# Patient Record
Sex: Male | Born: 1975 | Hispanic: Yes | Marital: Married | Smoking: Never smoker
Health system: Southern US, Community
[De-identification: ages and names within clinical notes are randomized; demographics above are authoritative.]

## PROBLEM LIST (undated history)

## (undated) DIAGNOSIS — J45909 Unspecified asthma, uncomplicated: Secondary | ICD-10-CM

## (undated) DIAGNOSIS — K5792 Diverticulitis of intestine, part unspecified, without perforation or abscess without bleeding: Secondary | ICD-10-CM

## (undated) DIAGNOSIS — K56609 Unspecified intestinal obstruction, unspecified as to partial versus complete obstruction: Secondary | ICD-10-CM

## (undated) HISTORY — DX: Diverticulitis of intestine, part unspecified, without perforation or abscess without bleeding: K57.92

## (undated) HISTORY — PX: APPENDECTOMY: SHX54

## (undated) HISTORY — PX: ABDOMINAL SURGERY: SHX537

## (undated) HISTORY — PX: HERNIA REPAIR: SHX51

---

## 2019-10-28 ENCOUNTER — Emergency Department (HOSPITAL_COMMUNITY): Payer: Self-pay

## 2019-10-28 ENCOUNTER — Encounter (HOSPITAL_COMMUNITY): Payer: Self-pay

## 2019-10-28 ENCOUNTER — Other Ambulatory Visit: Payer: Self-pay

## 2019-10-28 ENCOUNTER — Emergency Department (HOSPITAL_COMMUNITY)
Admission: EM | Admit: 2019-10-28 | Discharge: 2019-10-28 | Disposition: A | Discharge status: Home / Self Care | Payer: Self-pay | Attending: Emergency Medicine | Admitting: Emergency Medicine

## 2019-10-28 DIAGNOSIS — K5792 Diverticulitis of intestine, part unspecified, without perforation or abscess without bleeding: Secondary | ICD-10-CM | POA: Insufficient documentation

## 2019-10-28 DIAGNOSIS — J45909 Unspecified asthma, uncomplicated: Secondary | ICD-10-CM | POA: Insufficient documentation

## 2019-10-28 HISTORY — DX: Unspecified intestinal obstruction, unspecified as to partial versus complete obstruction: K56.609

## 2019-10-28 HISTORY — DX: Unspecified asthma, uncomplicated: J45.909

## 2019-10-28 LAB — COMPREHENSIVE METABOLIC PANEL
ALT: 39 U/L (ref 0–44)
AST: 28 U/L (ref 15–41)
Albumin: 4 g/dL (ref 3.5–5.0)
Alkaline Phosphatase: 39 U/L (ref 38–126)
Anion gap: 9 (ref 5–15)
BUN: 12 mg/dL (ref 6–20)
CO2: 25 mmol/L (ref 22–32)
Calcium: 8.9 mg/dL (ref 8.9–10.3)
Chloride: 106 mmol/L (ref 98–111)
Creatinine, Ser: 0.76 mg/dL (ref 0.61–1.24)
GFR calc Af Amer: >60 mL/min (ref >60)
GFR calc non Af Amer: >60 mL/min (ref >60)
Glucose, Bld: 108 mg/dL — ABNORMAL HIGH (ref 70–99)
Potassium: 4 mmol/L (ref 3.5–5.1)
Sodium: 140 mmol/L (ref 135–145)
Total Bilirubin: 0.8 mg/dL (ref 0.3–1.2)
Total Protein: 7.8 g/dL (ref 6.5–8.1)

## 2019-10-28 LAB — URINALYSIS, ROUTINE W REFLEX MICROSCOPIC
Bacteria, UA: NONE SEEN
Bilirubin Urine: NEGATIVE
Glucose, UA: NEGATIVE mg/dL
Hgb urine dipstick: NEGATIVE
Ketones, ur: NEGATIVE mg/dL
Leukocytes,Ua: NEGATIVE
Nitrite: NEGATIVE
Protein, ur: NEGATIVE mg/dL
Specific Gravity, Urine: >1.046 — ABNORMAL HIGH (ref 1.005–1.030)
pH: 7 (ref 5.0–8.0)

## 2019-10-28 LAB — CBC
HCT: 46.2 % (ref 39.0–52.0)
Hemoglobin: 15.6 g/dL (ref 13.0–17.0)
MCH: 31.5 pg (ref 26.0–34.0)
MCHC: 33.8 g/dL (ref 30.0–36.0)
MCV: 93.1 fL (ref 80.0–100.0)
Platelets: 221 10*3/uL (ref 150–400)
RBC: 4.96 MIL/uL (ref 4.22–5.81)
RDW: 12.2 % (ref 11.5–15.5)
WBC: 7.9 10*3/uL (ref 4.0–10.5)
nRBC: 0 % (ref 0.0–0.2)

## 2019-10-28 LAB — LIPASE, BLOOD: Lipase: 23 U/L (ref 11–51)

## 2019-10-28 MED ORDER — METRONIDAZOLE 500 MG PO TABS: 500.0000 mg | ORAL_TABLET | Freq: Three times a day (TID) | ORAL | 0 refills | Status: AC

## 2019-10-28 MED ORDER — SODIUM CHLORIDE 0.9% FLUSH: 3.0000 mL | Freq: Once | INTRAVENOUS | Status: DC

## 2019-10-28 MED ORDER — MORPHINE SULFATE (PF) 4 MG/ML IV SOLN
4.0000 mg | Freq: Once | INTRAVENOUS | Status: AC
  Administered 2019-10-28: 4 mg via INTRAVENOUS
  Filled 2019-10-28: qty 1

## 2019-10-28 MED ORDER — CIPROFLOXACIN HCL 500 MG PO TABS: 500.0000 mg | ORAL_TABLET | Freq: Two times a day (BID) | ORAL | 0 refills | Status: AC

## 2019-10-28 MED ORDER — IOHEXOL 300 MG/ML  SOLN
100.0000 mL | Freq: Once | INTRAMUSCULAR | Status: AC | PRN
  Administered 2019-10-28: 13:00:00 100 mL via INTRAVENOUS

## 2019-10-28 MED ORDER — SODIUM CHLORIDE (PF) 0.9 % IJ SOLN
INTRAMUSCULAR | Status: AC
  Filled 2019-10-28: qty 50

## 2019-10-28 NOTE — Discharge Instructions (Signed)
Take the antibiotics as directed. Take the entire course of antibiotics regardless of symptom improvement to prevent worsening or recurrence of your infection. Return to the ED if you start to have worsening abdominal pain that is unrelieved with Tylenol or ibuprofen, develop a fever, chest pain, shortness of breath.

## 2019-10-28 NOTE — ED Triage Notes (Signed)
Patient c/o lower abdominal pain that began a few days ago. Patient states the pain is worse when he coughs, moves, and palpates the area. Paitenat denies any N/v/D.

## 2019-10-28 NOTE — ED Provider Notes (Signed)
[facility]-EMERGENCY DEPT Provider Note   CSN: [id number] Arrival date & time: 10/28/19  1120     History Chief Complaint  Patient presents with  . Abdominal Pain    Marcus Thompson is a 44 y.o. male with a past medical history of bowel obstruction, prior appendectomy and inguinal hernia repair presenting to ED with a chief complaint of lower abdominal pain that has been intermittent for the past 3 to 4 days but worsened last night.  Reports constant pain since last night.  Denies any nausea, vomiting, diarrhea or constipation.  Reports that pain is sharp, located more so on the left side of the abdomen.  Pain is worse with movement and palpation and feels similar to when he had an inguinal hernia in the past.  He has not taken any medications to help with his symptoms.  Denies any dysuria, fever, sick contacts with similar symptoms, history of kidney stones, shortness of breath or chest pain.  HPI     Past Medical History:  Diagnosis Date  . Asthma   . Bowel obstruction (HCC)     There are no problems to display for this patient.   Past Surgical History:  Procedure Laterality Date  . ABDOMINAL SURGERY    . APPENDECTOMY    . HERNIA REPAIR         Family History  Problem Relation Age of Onset  . Hypertension Mother   . Cancer Father     Social History   Tobacco Use  . Smoking status: Never Smoker  . Smokeless tobacco: Never Used  Substance Use Topics  . Alcohol use: Yes  . Drug use: Never    Home Medications Prior to Admission medications   Medication Sig Start Date End Date Taking? Authorizing Provider  ciprofloxacin (CIPRO) 500 MG tablet Take 1 tablet (500 mg total) by mouth 2 (two) times daily for 7 days. 10/28/19 11/04/19  Kazden Largo, PA-C  metroNIDAZOLE (FLAGYL) 500 MG tablet Take 1 tablet (500 mg total) by mouth 3 (three) times daily for 7 days. 10/28/19 11/04/19  Elyshia Kumagai, Hillary Bow, PA-C    Allergies    Aspirin  Review of Systems   Review  of Systems  Constitutional: Negative for appetite change, chills and fever.  HENT: Negative for ear pain, rhinorrhea, sneezing and sore throat.   Eyes: Negative for photophobia and visual disturbance.  Respiratory: Negative for cough, chest tightness, shortness of breath and wheezing.   Cardiovascular: Negative for chest pain and palpitations.  Gastrointestinal: Positive for abdominal pain. Negative for blood in stool, constipation, diarrhea, nausea and vomiting.  Genitourinary: Negative for dysuria, hematuria and urgency.  Musculoskeletal: Negative for myalgias.  Skin: Negative for rash.  Neurological: Negative for dizziness, weakness and light-headedness.    Physical Exam Updated Vital Signs BP (!) 130/92 (BP Location: Right Arm)   Pulse 64   Temp 98.3 F (36.8 C) (Oral)   Resp 16   Ht 5\' 6"  (1.676 m)   Wt 93.4 kg   SpO2 98%   BMI 33.25 kg/m   Physical Exam Vitals and nursing note reviewed.  Constitutional:      General: He is not in acute distress.    Appearance: He is well-developed.  HENT:     Head: Normocephalic and atraumatic.     Nose: Nose normal.  Eyes:     General: No scleral icterus.       Left eye: No discharge.     Conjunctiva/sclera: Conjunctivae normal.  Cardiovascular:  Rate and Rhythm: Normal rate and regular rhythm.     Heart sounds: Normal heart sounds. No murmur. No friction rub. No gallop.   Pulmonary:     Effort: Pulmonary effort is normal. No respiratory distress.     Breath sounds: Normal breath sounds.  Abdominal:     General: Bowel sounds are normal. There is no distension.     Palpations: Abdomen is soft.     Tenderness: There is abdominal tenderness in the left lower quadrant. There is no right CVA tenderness, left CVA tenderness, guarding or rebound.  Musculoskeletal:        General: Normal range of motion.     Cervical back: Normal range of motion and neck supple.  Skin:    General: Skin is warm and dry.     Findings: No rash.    Neurological:     Mental Status: He is alert.     Motor: No abnormal muscle tone.     Coordination: Coordination normal.     ED Results / Procedures / Treatments   Labs (all labs ordered are listed, but only abnormal results are displayed) Labs Reviewed  COMPREHENSIVE METABOLIC PANEL - Abnormal; Notable for the following components:      Result Value   Glucose, Bld 108 (*)    All other components within normal limits  URINALYSIS, ROUTINE W REFLEX MICROSCOPIC - Abnormal; Notable for the following components:   Specific Gravity, Urine >1.046 (*)    All other components within normal limits  LIPASE, BLOOD  CBC    EKG None  Radiology CT ABDOMEN PELVIS W CONTRAST  Result Date: 10/28/2019 CLINICAL DATA:  Lower abdominal pain EXAM: CT ABDOMEN AND PELVIS WITH CONTRAST TECHNIQUE: Multidetector CT imaging of the abdomen and pelvis was performed using the standard protocol following bolus administration of intravenous contrast. CONTRAST:  OMNIPAQUE IOHEXOL 300 MG/ML  SOLN COMPARISON:  None. FINDINGS: Lower chest: No acute abnormality. Hepatobiliary: Diffusely decreased attenuation of the hepatic parenchyma suggesting hepatic steatosis. No focal hepatic lesion. Gallbladder appears unremarkable. No hyperdense gallstone. No biliary dilatation. Pancreas: Unremarkable. No pancreatic ductal dilatation or surrounding inflammatory changes. Spleen: Normal in size without focal abnormality. Adrenals/Urinary Tract: Adrenal glands are unremarkable. Kidneys are normal, without renal calculi, focal lesion, or hydronephrosis. Bladder is unremarkable. Stomach/Bowel: Numerous colonic diverticula. There is a mildly prominent diverticula within the proximal sigmoid colon with subtle pericolonic fat stranding (series 2, images 61-63). No adjacent free fluid, abscess, or extraluminal air. Stomach and small bowel are unremarkable. No bowel obstruction. Vascular/Lymphatic: No significant vascular findings are  present. No enlarged abdominal or pelvic lymph nodes. Reproductive: Prostate is unremarkable. Other: No abdominal wall hernia or abnormality. No abdominopelvic ascites. Musculoskeletal: No acute or significant osseous findings. IMPRESSION: 1. Early acute uncomplicated sigmoid diverticulitis. No adjacent free fluid, abscess, or extraluminal air. 2. Hepatic steatosis. Electronically Signed   By: Duanne Guess D.O.   On: 10/28/2019 13:33    Procedures Procedures (including critical care time)  Medications Ordered in ED Medications  sodium chloride flush (NS) 0.9 % injection 3 mL (3 mLs Intravenous Not Given 10/28/19 1359)  morphine 4 MG/ML injection 4 mg (has no administration in time range)  iohexol (OMNIPAQUE) 300 MG/ML solution 100 mL (100 mLs Intravenous Contrast Given 10/28/19 1313)  sodium chloride (PF) 0.9 % injection (  Given by Other 10/28/19 1359)    ED Course  I have reviewed the triage vital signs and the nursing notes.  Pertinent labs & imaging results that were  available during my care of the patient were reviewed by me and considered in my medical decision making (see chart for details).  Clinical Course as of Oct 28 1419  Fri Oct 28, 2019  1217 Patient declining any pain medication or antiemetics at this time.   [HK]  [id number] 44 year old male with prior history of bowel obstruction complaining of left lower quadrant pain similar to when he had an obstruction.  Still passing gas.  Soft abdomen without tympany on exam.  Disposition per results of CT.   [MB]    Clinical Course User Index [HK] Delia Heady, PA-C [MB] Hayden Rasmussen, MD   MDM Rules/Calculators/A&P                      44 year old male with prior bowel obstruction, history of appendicitis and inguinal hernia repair presenting to the ED with left lower quadrant abdominal pain that began a few days ago.  Denies any changes to bowel movements, urination or vomiting.  On exam patient is tender in the left lower  quadrant area without rebound or guarding.  Vital signs are within normal limits, he is afebrile with no recent use of antipyretics.  CBC, CMP, lipase and urinalysis unremarkable.  CT scan done to rule out bowel obstruction without evidence of bowel obstruction but does show acute early sigmoid diverticulitis.  Patient denies history of of diverticulitis in the past.  He is tolerating p.o. without difficulty and pain controlled here.  Will be discharged home with Cipro and Flagyl and strict return precautions.  Patient is hemodynamically stable, in NAD, and able to ambulate in the ED. Evaluation does not show pathology that would require ongoing emergent intervention or inpatient treatment. I have personally reviewed and interpreted all lab work and imaging at today's ED visit. I explained the diagnosis to the patient. Pain has been managed and has no complaints prior to discharge. Patient is comfortable with above plan and is stable for discharge at this time. All questions were answered prior to disposition. Strict return precautions for returning to the ED were discussed. Encouraged follow up with PCP.   An After Visit Summary was printed and given to the patient.   Portions of this note were generated with [employer]. Dictation errors may occur despite best attempts at proofreading.  Final Clinical Impression(s) / ED Diagnoses Final diagnoses:  Diverticulitis    Rx / DC Orders ED Discharge Orders         Ordered    metroNIDAZOLE (FLAGYL) 500 MG tablet  3 times daily     10/28/19 1415    ciprofloxacin (CIPRO) 500 MG tablet  2 times daily     10/28/19 1415           Delia Heady, PA-C 10/28/19 1421    Hayden Rasmussen, MD 10/28/19 1816

## 2021-09-10 ENCOUNTER — Encounter: Payer: Self-pay | Admitting: Emergency Medicine

## 2021-09-10 ENCOUNTER — Ambulatory Visit
Admission: EM | Admit: 2021-09-10 | Discharge: 2021-09-10 | Disposition: A | Discharge status: Home / Self Care | Payer: 59 | Attending: Physician Assistant | Admitting: Physician Assistant

## 2021-09-10 ENCOUNTER — Other Ambulatory Visit: Payer: Self-pay

## 2021-09-10 DIAGNOSIS — J020 Streptococcal pharyngitis: Secondary | ICD-10-CM

## 2021-09-10 LAB — POCT RAPID STREP A (OFFICE): Rapid Strep A Screen: POSITIVE — AB

## 2021-09-10 MED ORDER — CEFTRIAXONE SODIUM 1 G IJ SOLR
1.0000 g | Freq: Once | INTRAMUSCULAR | Status: AC
  Administered 2021-09-10: 1 g via INTRAMUSCULAR

## 2021-09-10 MED ORDER — AMOXICILLIN 500 MG PO CAPS: 500.0000 mg | ORAL_CAPSULE | Freq: Three times a day (TID) | ORAL | 0 refills | Status: DC

## 2021-09-10 MED ORDER — METHYLPREDNISOLONE SODIUM SUCC 125 MG IJ SOLR
125.0000 mg | Freq: Once | INTRAMUSCULAR | Status: AC
  Administered 2021-09-10: 125 mg via INTRAMUSCULAR

## 2021-09-10 NOTE — ED Provider Notes (Signed)
[facility]    CSN: [id number] Arrival date & time: 09/10/21  0907      History   Chief Complaint Chief Complaint  Patient presents with   Sore Throat    HPI Marcus Thompson is a 46 y.o. male.   Patient here today for evaluation of sore throat he has had for 7 days.  He states that today symptoms seem to be worsening is developed some fever and body aches as well.  He has tried Tylenol without significant relief.  He reports his daughter has also been diagnosed with pharyngitis.  The history is provided by the patient.  Sore Throat Pertinent negatives include no abdominal pain and no shortness of breath.   Past Medical History:  Diagnosis Date   Asthma    Bowel obstruction (HCC)     There are no problems to display for this patient.   Past Surgical History:  Procedure Laterality Date   ABDOMINAL SURGERY     APPENDECTOMY     HERNIA REPAIR         Home Medications    Prior to Admission medications   Medication Sig Start Date End Date Taking? Authorizing Provider  amoxicillin (AMOXIL) 500 MG capsule Take 1 capsule (500 mg total) by mouth 3 (three) times daily. 09/10/21  Yes Tomi Bamberger, PA-C    Family History Family History  Problem Relation Age of Onset   Hypertension Mother    Cancer Father     Social History Social History   Tobacco Use   Smoking status: Never   Smokeless tobacco: Never  Vaping Use   Vaping Use: Never used  Substance Use Topics   Alcohol use: Yes   Drug use: Never     Allergies   Aspirin   Review of Systems Review of Systems  Constitutional:  Positive for chills and fever.  HENT:  Positive for sore throat and trouble swallowing. Negative for congestion and ear pain.   Eyes:  Negative for discharge and redness.  Respiratory:  Negative for cough and shortness of breath.   Gastrointestinal:  Negative for abdominal pain, nausea and vomiting.  Musculoskeletal:  Positive for myalgias.    Physical  Exam Triage Vital Signs ED Triage Vitals [09/10/21 0934]  Enc Vitals Group     BP (!) 142/91     Pulse Rate (!) 119     Resp      Temp 99.3 F (37.4 C)     Temp Source Oral     SpO2 94 %     Weight 185 lb (83.9 kg)     Height 5\' 6"  (1.676 m)     Head Circumference      Peak Flow      Pain Score 10     Pain Loc      Pain Edu?      Excl. in GC?    No data found.  Updated Vital Signs BP (!) 142/91 (BP Location: Left Arm)    Pulse (!) 119    Temp 99.3 F (37.4 C) (Oral)    Ht 5\' 6"  (1.676 m)    Wt 185 lb (83.9 kg)    SpO2 94%    BMI 29.86 kg/m   Physical Exam Vitals and nursing note reviewed.  Constitutional:      General: He is not in acute distress.    Appearance: Normal appearance. He is not ill-appearing.  HENT:     Head: Normocephalic and atraumatic.  Nose: Nose normal. No congestion.     Mouth/Throat:     Mouth: Mucous membranes are moist.     Pharynx: Oropharynx is clear. No oropharyngeal exudate or posterior oropharyngeal erythema.     Tonsils: 4+ on the right. 4+ on the left.     Comments: Significant swelling to bilateral tonsils, patient is maintaining secretions however is not having any difficulty breathing Eyes:     Conjunctiva/sclera: Conjunctivae normal.  Cardiovascular:     Rate and Rhythm: Normal rate.  Pulmonary:     Effort: Pulmonary effort is normal. No respiratory distress.  Skin:    General: Skin is warm and dry.  Neurological:     Mental Status: He is alert.  Psychiatric:        Mood and Affect: Mood normal.        Thought Content: Thought content normal.     UC Treatments / Results  Labs (all labs ordered are listed, but only abnormal results are displayed) Labs Reviewed  POCT RAPID STREP A (OFFICE) - Abnormal; Notable for the following components:      Result Value   Rapid Strep A Screen Positive (*)    All other components within normal limits    EKG   Radiology No results found.  Procedures Procedures (including  critical care time)  Medications Ordered in UC Medications  methylPREDNISolone sodium succinate (SOLU-MEDROL) 125 mg/2 mL injection 125 mg (125 mg Intramuscular Given 09/10/21 1007)  cefTRIAXone (ROCEPHIN) injection 1 g (1 g Intramuscular Given 09/10/21 1007)    Initial Impression / Assessment and Plan / UC Course  I have reviewed the triage vital signs and the nursing notes.  Pertinent labs & imaging results that were available during my care of the patient were reviewed by me and considered in my medical decision making (see chart for details).    Initially recommended emergency department evaluation due to swelling of tonsils but patient refuses.  We will trial injection of methylprednisolone as well as Rocephin in hopes to rapidly improve symptoms but did recommend further evaluation in the ED with any worsening.  Patient agrees.  Amoxicillin also sent to pharmacy for treatment.  Final Clinical Impressions(s) / UC Diagnoses   Final diagnoses:  Acute streptococcal pharyngitis     Discharge Instructions         REPORT TO ED IMMEDIATELY WITH ANY WORSENING        ED Prescriptions     Medication Sig Dispense Auth. Provider   amoxicillin (AMOXIL) 500 MG capsule Take 1 capsule (500 mg total) by mouth 3 (three) times daily. 21 capsule Tomi Bamberger, PA-C      PDMP not reviewed this encounter.   Tomi Bamberger, PA-C 09/10/21 1010

## 2021-09-10 NOTE — ED Notes (Signed)
Rocephin 1grm given in right upper out quadrant

## 2021-09-10 NOTE — ED Triage Notes (Signed)
Patient c/o sore throat x 7 days, worse yesterday, fever, body aches.  Patient has taken Tylenol.  Patient is vaccinated for COVID.

## 2021-09-10 NOTE — Discharge Instructions (Signed)
° ° °  REPORT TO ED IMMEDIATELY WITH ANY WORSENING

## 2021-11-25 ENCOUNTER — Emergency Department (HOSPITAL_COMMUNITY)
Admission: EM | Admit: 2021-11-25 | Discharge: 2021-11-25 | Disposition: A | Discharge status: Home / Self Care | Payer: Self-pay | Attending: Emergency Medicine | Admitting: Emergency Medicine

## 2021-11-25 ENCOUNTER — Other Ambulatory Visit: Payer: Self-pay

## 2021-11-25 ENCOUNTER — Encounter (HOSPITAL_COMMUNITY): Payer: Self-pay | Admitting: Oncology

## 2021-11-25 ENCOUNTER — Emergency Department (HOSPITAL_COMMUNITY): Payer: Self-pay

## 2021-11-25 DIAGNOSIS — K5792 Diverticulitis of intestine, part unspecified, without perforation or abscess without bleeding: Secondary | ICD-10-CM

## 2021-11-25 DIAGNOSIS — K5732 Diverticulitis of large intestine without perforation or abscess without bleeding: Secondary | ICD-10-CM | POA: Insufficient documentation

## 2021-11-25 DIAGNOSIS — J45909 Unspecified asthma, uncomplicated: Secondary | ICD-10-CM | POA: Insufficient documentation

## 2021-11-25 LAB — COMPREHENSIVE METABOLIC PANEL
ALT: 26 U/L (ref 0–44)
AST: 16 U/L (ref 15–41)
Albumin: 3.8 g/dL (ref 3.5–5.0)
Alkaline Phosphatase: 46 U/L (ref 38–126)
Anion gap: 5 (ref 5–15)
BUN: 14 mg/dL (ref 6–20)
CO2: 27 mmol/L (ref 22–32)
Calcium: 8.9 mg/dL (ref 8.9–10.3)
Chloride: 103 mmol/L (ref 98–111)
Creatinine, Ser: 0.81 mg/dL (ref 0.61–1.24)
GFR, Estimated: >60 mL/min (ref >60)
Glucose, Bld: 107 mg/dL — ABNORMAL HIGH (ref 70–99)
Potassium: 3.7 mmol/L (ref 3.5–5.1)
Sodium: 135 mmol/L (ref 135–145)
Total Bilirubin: 0.8 mg/dL (ref 0.3–1.2)
Total Protein: 7.7 g/dL (ref 6.5–8.1)

## 2021-11-25 LAB — CBC WITH DIFFERENTIAL/PLATELET
Abs Immature Granulocytes: 0.09 10*3/uL — ABNORMAL HIGH (ref 0.00–0.07)
Basophils Absolute: 0.1 10*3/uL (ref 0.0–0.1)
Basophils Relative: 1 %
Eosinophils Absolute: 0.2 10*3/uL (ref 0.0–0.5)
Eosinophils Relative: 3 %
HCT: 43.2 % (ref 39.0–52.0)
Hemoglobin: 15 g/dL (ref 13.0–17.0)
Immature Granulocytes: 1 %
Lymphocytes Relative: 25 %
Lymphs Abs: 2.1 10*3/uL (ref 0.7–4.0)
MCH: 32.3 pg (ref 26.0–34.0)
MCHC: 34.7 g/dL (ref 30.0–36.0)
MCV: 92.9 fL (ref 80.0–100.0)
Monocytes Absolute: 1.1 10*3/uL — ABNORMAL HIGH (ref 0.1–1.0)
Monocytes Relative: 13 %
Neutro Abs: 4.8 10*3/uL (ref 1.7–7.7)
Neutrophils Relative %: 57 %
Platelets: 209 10*3/uL (ref 150–400)
RBC: 4.65 MIL/uL (ref 4.22–5.81)
RDW: 12.5 % (ref 11.5–15.5)
WBC: 8.4 10*3/uL (ref 4.0–10.5)
nRBC: 0 % (ref 0.0–0.2)

## 2021-11-25 LAB — URINALYSIS, ROUTINE W REFLEX MICROSCOPIC
Bilirubin Urine: NEGATIVE
Glucose, UA: NEGATIVE mg/dL
Hgb urine dipstick: NEGATIVE
Ketones, ur: NEGATIVE mg/dL
Leukocytes,Ua: NEGATIVE
Nitrite: NEGATIVE
Protein, ur: NEGATIVE mg/dL
Specific Gravity, Urine: 1.039 — ABNORMAL HIGH (ref 1.005–1.030)
pH: 6 (ref 5.0–8.0)

## 2021-11-25 MED ORDER — OXYCODONE-ACETAMINOPHEN 5-325 MG PO TABS
1.0000 | ORAL_TABLET | Freq: Once | ORAL | Status: AC
  Administered 2021-11-25: 1 via ORAL
  Filled 2021-11-25: qty 1

## 2021-11-25 MED ORDER — AMOXICILLIN-POT CLAVULANATE 875-125 MG PO TABS: 1.0000 | ORAL_TABLET | Freq: Two times a day (BID) | ORAL | 0 refills | Status: DC

## 2021-11-25 MED ORDER — IOHEXOL 300 MG/ML  SOLN
100.0000 mL | Freq: Once | INTRAMUSCULAR | Status: AC | PRN
  Administered 2021-11-25: 100 mL via INTRAVENOUS

## 2021-11-25 MED ORDER — LACTATED RINGERS IV SOLN: INTRAVENOUS | Status: DC

## 2021-11-25 MED ORDER — MORPHINE SULFATE (PF) 4 MG/ML IV SOLN
4.0000 mg | Freq: Once | INTRAVENOUS | Status: AC
  Administered 2021-11-25: 4 mg via INTRAVENOUS
  Filled 2021-11-25: qty 1

## 2021-11-25 MED ORDER — AMOXICILLIN-POT CLAVULANATE 875-125 MG PO TABS
1.0000 | ORAL_TABLET | Freq: Once | ORAL | Status: AC
  Administered 2021-11-25: 1 via ORAL
  Filled 2021-11-25: qty 1

## 2021-11-25 MED ORDER — OXYCODONE-ACETAMINOPHEN 5-325 MG PO TABS: 1.0000 | ORAL_TABLET | Freq: Four times a day (QID) | ORAL | 0 refills | Status: DC | PRN

## 2021-11-25 MED ORDER — ONDANSETRON HCL 4 MG/2ML IJ SOLN
4.0000 mg | Freq: Once | INTRAMUSCULAR | Status: AC
  Administered 2021-11-25: 4 mg via INTRAVENOUS
  Filled 2021-11-25: qty 2

## 2021-11-25 NOTE — ED Provider Notes (Signed)
?[facility] ?Provider Note ? ? ?CSN: [id number] ?Arrival date & time: 11/25/21  1139 ? ?  ? ?History ? ?Chief Complaint  ?Patient presents with  ? Abdominal Pain  ? ? ?Marcus Thompson is a 46 y.o. male. ? ?Patient is a 46 year old male with a history of asthma, diverticulitis, prior bowel obstruction status post appendectomy and hernia repair who is presenting today with 3 days of left lower quadrant pain.  He reports the pain is gradually worsening and now present anytime he coughs, moves in a little worse after eating.  He did notice a subjective fever last night but denies 1 this morning.  He has not had any nausea, vomiting but did have an episode of diarrhea on Saturday. ? ?The history is provided by the patient.  ?Abdominal Pain ? ?  ? ?Home Medications ?Prior to Admission medications   ?Medication Sig Start Date End Date Taking? Authorizing Provider  ?amoxicillin-clavulanate (AUGMENTIN) 875-125 MG tablet Take 1 tablet by mouth every 12 (twelve) hours. 11/25/21  Yes Blanchie Dessert, MD  ?oxyCODONE-acetaminophen (PERCOCET/ROXICET) 5-325 MG tablet Take 1 tablet by mouth every 6 (six) hours as needed for severe pain. 11/25/21  Yes Blanchie Dessert, MD  ?amoxicillin (AMOXIL) 500 MG capsule Take 1 capsule (500 mg total) by mouth 3 (three) times daily. 09/10/21   Francene Finders, PA-C  ?   ? ?Allergies    ?Aspirin   ? ?Review of Systems   ?Review of Systems  ?Gastrointestinal:  Positive for abdominal pain.  ? ?Physical Exam ?Updated Vital Signs ?BP (!) 128/98   Pulse 70   Temp 98.9 ?F (37.2 ?C) (Oral)   Resp 15   SpO2 97%  ?Physical Exam ?Vitals and nursing note reviewed.  ?Constitutional:   ?   General: He is not in acute distress. ?   Appearance: He is well-developed.  ?HENT:  ?   Head: Normocephalic and atraumatic.  ?Eyes:  ?   Conjunctiva/sclera: Conjunctivae normal.  ?   Pupils: Pupils are equal, round, and reactive to light.  ?Cardiovascular:  ?   Rate and Rhythm: Normal rate  and regular rhythm.  ?   Heart sounds: No murmur heard. ?Pulmonary:  ?   Effort: Pulmonary effort is normal. No respiratory distress.  ?   Breath sounds: Normal breath sounds. No wheezing or rales.  ?Abdominal:  ?   General: There is no distension.  ?   Palpations: Abdomen is soft.  ?   Tenderness: There is abdominal tenderness in the left upper quadrant and left lower quadrant. There is guarding. There is no rebound.  ?Musculoskeletal:     ?   General: No tenderness. Normal range of motion.  ?   Cervical back: Normal range of motion and neck supple.  ?Skin: ?   General: Skin is warm and dry.  ?   Findings: No erythema or rash.  ?Neurological:  ?   Mental Status: He is alert and oriented to person, place, and time. Mental status is at baseline.  ?Psychiatric:     ?   Behavior: Behavior normal.  ? ? ?ED Results / Procedures / Treatments   ?Labs ?(all labs ordered are listed, but only abnormal results are displayed) ?Labs Reviewed  ?CBC WITH DIFFERENTIAL/PLATELET - Abnormal; Notable for the following components:  ?    Result Value  ? Monocytes Absolute 1.1 (*)   ? Abs Immature Granulocytes 0.09 (*)   ? All other components within normal limits  ?COMPREHENSIVE METABOLIC PANEL -  Abnormal; Notable for the following components:  ? Glucose, Bld 107 (*)   ? All other components within normal limits  ?URINALYSIS, ROUTINE W REFLEX MICROSCOPIC  ? ? ?EKG ?None ? ?Radiology ?CT ABDOMEN PELVIS W CONTRAST ? ?Result Date: 11/25/2021 ?CLINICAL DATA:  Left lower quadrant pain for 3 days. EXAM: CT ABDOMEN AND PELVIS WITH CONTRAST TECHNIQUE: Multidetector CT imaging of the abdomen and pelvis was performed using the standard protocol following bolus administration of intravenous contrast. RADIATION DOSE REDUCTION: This exam was performed according to the departmental dose-optimization program which includes automated exposure control, adjustment of the mA and/or kV according to patient size and/or use of iterative reconstruction  technique. CONTRAST:  162mL OMNIPAQUE IOHEXOL 300 MG/ML  SOLN COMPARISON:  10/28/2019. FINDINGS: Lower chest: Lung bases are clear. Heart size normal. No pericardial or pleural effusion. Distal esophagus is unremarkable. Hepatobiliary: Liver may be slightly decreased in attenuation diffusely. Liver and gallbladder are otherwise unremarkable. No biliary ductal dilatation. Pancreas: Negative. Spleen: Negative. Adrenals/Urinary Tract: Adrenal glands and kidneys are unremarkable. Ureters are decompressed. Bladder is low in volume. Stomach/Bowel: Stomach, small bowel, appendix and majority of the colon are unremarkable. Wall thickening and pericolonic inflammatory haziness/stranding involving the sigmoid colon. No extraluminal air or organized fluid collection. Vascular/Lymphatic: Vascular structures are unremarkable. No pathologically enlarged lymph nodes. Reproductive: Prostate is visualized. Other: Small right inguinal hernia contains fat. Left inguinal hernia repair. No free fluid. Mesenteries and peritoneum are otherwise unremarkable. Musculoskeletal: No worrisome lytic or sclerotic lesions. IMPRESSION: 1. Acute uncomplicated sigmoid diverticulitis. 2. Liver may be steatotic. 3. Small right inguinal hernia contains fat. Electronically Signed   By: Lorin Picket M.D.   On: 11/25/2021 13:00   ? ?Procedures ?Procedures  ? ? ?Medications Ordered in ED ?Medications  ?lactated ringers infusion ( Intravenous New Bag/Given 11/25/21 1236)  ?morphine (PF) 4 MG/ML injection 4 mg (4 mg Intravenous Given 11/25/21 1235)  ?ondansetron (ZOFRAN) injection 4 mg (4 mg Intravenous Given 11/25/21 1235)  ?iohexol (OMNIPAQUE) 300 MG/ML solution 100 mL (100 mLs Intravenous Contrast Given 11/25/21 1242)  ?amoxicillin-clavulanate (AUGMENTIN) 875-125 MG per tablet 1 tablet (1 tablet Oral Given 11/25/21 1358)  ?oxyCODONE-acetaminophen (PERCOCET/ROXICET) 5-325 MG per tablet 1 tablet (1 tablet Oral Given 11/25/21 1358)  ? ? ?ED Course/ Medical Decision  Making/ A&P ?  ?                        ?Medical Decision Making ?Amount and/or Complexity of Data Reviewed ?External Data Reviewed: notes. ?Labs: ordered. Decision-making details documented in ED Course. ?Radiology: ordered and independent interpretation performed. Decision-making details documented in ED Course. ? ?Risk ?Prescription drug management. ?Parenteral controlled substances. ? ? ?Patient is a 46 year old male presenting today with left lower quadrant abdominal pain.  The pain is worse with any movement.  This has been worsening over the last 3 days.  Patient's symptoms are most consistent with diverticulitis but he also has a history of bowel obstruction.  However today he is not having obstructive symptoms such as nausea vomiting or no bowel movements.  He last had diarrhea on Saturday.  He has had the same amount of oral intake but did report some pain after eating yesterday.  He is well-appearing on exam.  Vital signs are reassuring.  Labs and imaging are pending. ? ?2:16 PM ?I independently interpreted patient's labs today which showed normal CBC, CMP.  I independently visualized and interpreted abdominal CT that shows no evidence of bowel obstruction.  Radiology reports  acute uncomplicated sigmoid diverticulitis.  Findings were discussed with the patient.  He is otherwise stable.  Will discharge home with antibiotics and pain control.  He was given return precautions.  No indication for admission today.  No social barriers preventing his discharge. ? ? ? ? ? ? ? ?Final Clinical Impression(s) / ED Diagnoses ?Final diagnoses:  ?Diverticulitis  ?Acute diverticulitis  ? ? ?Rx / DC Orders ?ED Discharge Orders   ? ?      Ordered  ?  oxyCODONE-acetaminophen (PERCOCET/ROXICET) 5-325 MG tablet  Every 6 hours PRN       ? 11/25/21 1416  ?  amoxicillin-clavulanate (AUGMENTIN) 875-125 MG tablet  Every 12 hours       ? 11/25/21 1416  ? ?  ?  ? ?  ? ? ?  ?Blanchie Dessert, MD ?11/25/21 1416 ? ?

## 2021-11-25 NOTE — Discharge Instructions (Addendum)
If you start to feel constipated you need to make sure you are taking a stool softener to keep your bowels regular.  Also once this episode is resolved it would be a good idea to increase the fiber in your diet.  If you start having worsening pain, fever, vomiting you should return to the emergency room. ?

## 2021-11-25 NOTE — ED Triage Notes (Signed)
Pt c/o LLQ abdominal pain x 3 days.  Denies N/V.  Endorses diarrhea on the first night only.   ?

## 2021-11-25 NOTE — ED Notes (Signed)
Pt aware urine sample needed. Urinal at bedside.

## 2022-07-25 DIAGNOSIS — Z419 Encounter for procedure for purposes other than remedying health state, unspecified: Secondary | ICD-10-CM | POA: Diagnosis not present

## 2022-07-30 ENCOUNTER — Emergency Department (HOSPITAL_COMMUNITY)
Admission: EM | Admit: 2022-07-30 | Discharge: 2022-07-31 | Discharge status: Against Medical Advice | Payer: Commercial Managed Care - HMO | Attending: Emergency Medicine | Admitting: Emergency Medicine

## 2022-07-30 ENCOUNTER — Other Ambulatory Visit: Payer: Self-pay

## 2022-07-30 ENCOUNTER — Emergency Department (HOSPITAL_COMMUNITY): Payer: Commercial Managed Care - HMO

## 2022-07-30 DIAGNOSIS — Z5321 Procedure and treatment not carried out due to patient leaving prior to being seen by health care provider: Secondary | ICD-10-CM | POA: Diagnosis not present

## 2022-07-30 DIAGNOSIS — R1032 Left lower quadrant pain: Secondary | ICD-10-CM | POA: Diagnosis not present

## 2022-07-30 LAB — CBC WITH DIFFERENTIAL/PLATELET
Abs Immature Granulocytes: 0.03 10*3/uL (ref 0.00–0.07)
Basophils Absolute: 0.1 10*3/uL (ref 0.0–0.1)
Basophils Relative: 1 %
Eosinophils Absolute: 0.1 10*3/uL (ref 0.0–0.5)
Eosinophils Relative: 1 %
HCT: 43.6 % (ref 39.0–52.0)
Hemoglobin: 15 g/dL (ref 13.0–17.0)
Immature Granulocytes: 0 %
Lymphocytes Relative: 20 %
Lymphs Abs: 2.2 10*3/uL (ref 0.7–4.0)
MCH: 32.1 pg (ref 26.0–34.0)
MCHC: 34.4 g/dL (ref 30.0–36.0)
MCV: 93.4 fL (ref 80.0–100.0)
Monocytes Absolute: 1.6 10*3/uL — ABNORMAL HIGH (ref 0.1–1.0)
Monocytes Relative: 15 %
Neutro Abs: 7 10*3/uL (ref 1.7–7.7)
Neutrophils Relative %: 63 %
Platelets: 223 10*3/uL (ref 150–400)
RBC: 4.67 MIL/uL (ref 4.22–5.81)
RDW: 12.4 % (ref 11.5–15.5)
WBC: 11 10*3/uL — ABNORMAL HIGH (ref 4.0–10.5)
nRBC: 0 % (ref 0.0–0.2)

## 2022-07-30 LAB — URINALYSIS, ROUTINE W REFLEX MICROSCOPIC
Bilirubin Urine: NEGATIVE
Glucose, UA: NEGATIVE mg/dL
Hgb urine dipstick: NEGATIVE
Ketones, ur: NEGATIVE mg/dL
Leukocytes,Ua: NEGATIVE
Nitrite: NEGATIVE
Protein, ur: NEGATIVE mg/dL
Specific Gravity, Urine: 1.032 — ABNORMAL HIGH (ref 1.005–1.030)
pH: 5 (ref 5.0–8.0)

## 2022-07-30 LAB — COMPREHENSIVE METABOLIC PANEL
ALT: 21 U/L (ref 0–44)
AST: 18 U/L (ref 15–41)
Albumin: 3.8 g/dL (ref 3.5–5.0)
Alkaline Phosphatase: 41 U/L (ref 38–126)
Anion gap: 8 (ref 5–15)
BUN: 14 mg/dL (ref 6–20)
CO2: 25 mmol/L (ref 22–32)
Calcium: 9.1 mg/dL (ref 8.9–10.3)
Chloride: 105 mmol/L (ref 98–111)
Creatinine, Ser: 0.91 mg/dL (ref 0.61–1.24)
GFR, Estimated: >60 mL/min (ref >60)
Glucose, Bld: 104 mg/dL — ABNORMAL HIGH (ref 70–99)
Potassium: 3.8 mmol/L (ref 3.5–5.1)
Sodium: 138 mmol/L (ref 135–145)
Total Bilirubin: 0.9 mg/dL (ref 0.3–1.2)
Total Protein: 7.5 g/dL (ref 6.5–8.1)

## 2022-07-30 LAB — LIPASE, BLOOD: Lipase: 28 U/L (ref 11–51)

## 2022-07-30 MED ORDER — IOHEXOL 350 MG/ML SOLN
75.0000 mL | Freq: Once | INTRAVENOUS | Status: AC | PRN
  Administered 2022-07-30: 75 mL via INTRAVENOUS

## 2022-07-30 NOTE — ED Triage Notes (Signed)
Patient reports persistent LLQ abdominal pain onset last night , no emesis or diarrhea , denies fever or chills .

## 2022-07-30 NOTE — ED Provider Triage Note (Signed)
Emergency Medicine Provider Triage Evaluation Note  Crist Paynter , a 46 y.o. male  was evaluated in triage.  Pt complains of abd pain. Having LLQ pain since last night.  Also notice some urinary discomfort.  No fever, n/v/d/constipation.    Review of Systems  Positive: As above Negative: As above  Physical Exam  BP (!) 143/106 (BP Location: Right Arm)   Pulse 93   Temp 98.9 F (37.2 C) (Oral)   Resp 16   SpO2 97%  Gen:   Awake, no distress   Resp:  Normal effort  MSK:   Moves extremities without difficulty  Other:    Medical Decision Making  Medically screening exam initiated at 8:06 PM.  Appropriate orders placed.  Denilson Bortner was informed that the remainder of the evaluation will be completed by another provider, this initial triage assessment does not replace that evaluation, and the importance of remaining in the ED until their evaluation is complete.     Fayrene Helper, PA-C 07/30/22 2009

## 2022-07-31 ENCOUNTER — Ambulatory Visit
Admission: EM | Admit: 2022-07-31 | Discharge: 2022-07-31 | Disposition: A | Discharge status: Home / Self Care | Payer: Commercial Managed Care - HMO | Attending: Internal Medicine | Admitting: Internal Medicine

## 2022-07-31 ENCOUNTER — Other Ambulatory Visit: Payer: Self-pay

## 2022-07-31 ENCOUNTER — Encounter: Payer: Self-pay | Admitting: Emergency Medicine

## 2022-07-31 ENCOUNTER — Ambulatory Visit: Payer: Self-pay

## 2022-07-31 DIAGNOSIS — K5792 Diverticulitis of intestine, part unspecified, without perforation or abscess without bleeding: Secondary | ICD-10-CM | POA: Diagnosis not present

## 2022-07-31 MED ORDER — ACETAMINOPHEN 325 MG PO TABS
650.0000 mg | ORAL_TABLET | Freq: Once | ORAL | Status: AC
  Administered 2022-07-31: 650 mg via ORAL
  Filled 2022-07-31: qty 2

## 2022-07-31 MED ORDER — AMOXICILLIN-POT CLAVULANATE 875-125 MG PO TABS: 1.0000 | ORAL_TABLET | Freq: Three times a day (TID) | ORAL | 0 refills | Status: AC

## 2022-07-31 MED ORDER — OXYCODONE-ACETAMINOPHEN 5-325 MG PO TABS: 1.0000 | ORAL_TABLET | Freq: Four times a day (QID) | ORAL | 0 refills | Status: AC | PRN

## 2022-07-31 NOTE — Telephone Encounter (Signed)
  Chief Complaint: abdominal pain  Symptoms: LLQ abdominal pain, 11/10, constant  Frequency: since Tuesday PM Pertinent Negatives: NA Disposition: [] ED /[x] Urgent Care (no appt availability in office) / [] Appointment(In office/virtual)/ []  [facility]/ [] Home Care/ [] Refused Recommended Disposition /[] [facility]/ []  Follow-up with PCP Additional Notes: Pt was seen at ED last night and left after waiting many hours, pt had CT done and seen results on Mychart and wants to be prescribed medication. Pt doesn't have PCP so advised he can go to UC and be seen. Pt was currently at [facility] and was getting checked in and said they would see him today. No further assistance needed.   Reason for Disposition  [1] SEVERE pain (e.g., excruciating) AND [2] present > 1 hour  Answer Assessment - Initial Assessment Questions 1. LOCATION: "Where does it hurt?"      LLQ 3. ONSET: "When did the pain begin?" (Minutes, hours or days ago)      Tuesday PM  5. PATTERN "Does the pain come and go, or is it constant?"    - If it comes and goes: "How long does it last?" "Do you have pain now?"     (Note: Comes and goes means the pain is intermittent. It goes away completely between bouts.)    - If constant: "Is it getting better, staying the same, or getting worse?"      (Note: Constant means the pain never goes away completely; most serious pain is constant and gets worse.)      constant 6. SEVERITY: "How bad is the pain?"  (e.g., Scale 1-10; mild, moderate, or severe)    - MILD (1-3): Doesn't interfere with normal activities, abdomen soft and not tender to touch.     - MODERATE (4-7): Interferes with normal activities or awakens from sleep, abdomen tender to touch.     - SEVERE (8-10): Excruciating pain, doubled over, unable to do any normal activities.       11/10 9. RELIEVING/AGGRAVATING FACTORS: "What makes it better or worse?" (e.g., antacids, bending or twisting motion, bowel movement)       10. OTHER SYMPTOMS: "Do you have any other symptoms?" (e.g., back pain, diarrhea, fever, urination pain, vomiting)  Protocols used: Abdominal Pain - Male-A-AH

## 2022-07-31 NOTE — Discharge Instructions (Addendum)
You have diverticulitis which is being treated with an antibiotic.  I have also prescribed you pain medication to take as needed.  Please use this sparingly as it can cause drowsiness.  Do not drive or drink alcohol while taking this.  Follow-up in the ER if symptoms persist or worsen.

## 2022-07-31 NOTE — ED Triage Notes (Signed)
Pt here for continued abd pain; pt had CT last night but LWBS after 12 hour wait

## 2022-07-31 NOTE — ED Notes (Signed)
Patient left on own accord °

## 2022-07-31 NOTE — ED Provider Notes (Addendum)
[facility]    CSN: [id number] Arrival date & time: 07/31/22  1633      History   Chief Complaint Chief Complaint  Patient presents with   Abdominal Pain    HPI Marcus Thompson is a 46 y.o. male.   Patient presents with left lower quadrant abdominal pain that started about 2 days ago.  Patient originally presented to the ER due to symptoms but left due to wait time.  He had a CT scan prior to leaving as well as some blood work.  He reports the pain is cramping and stabbing in nature and is rated 11/10 on pain scale.  He does have a history of diverticulitis.  He has not taken any medications to help alleviate symptoms.  Denies any associated fever but states that he has felt feverish.  Denies nausea, vomiting, diarrhea.  Last BM was this morning.  Denies blood in stool.   Abdominal Pain   Past Medical History:  Diagnosis Date   Asthma    Bowel obstruction (HCC)     There are no problems to display for this patient.   Past Surgical History:  Procedure Laterality Date   ABDOMINAL SURGERY     APPENDECTOMY     HERNIA REPAIR         Home Medications    Prior to Admission medications   Medication Sig Start Date End Date Taking? Authorizing Provider  amoxicillin-clavulanate (AUGMENTIN) 875-125 MG tablet Take 1 tablet by mouth every 8 (eight) hours for 10 days. 07/31/22 08/10/22 Yes Sila Sarsfield, Acie Fredrickson, FNP  oxyCODONE-acetaminophen (PERCOCET/ROXICET) 5-325 MG tablet Take 1 tablet by mouth every 6 (six) hours as needed for severe pain. 07/31/22  Yes Kyshaun Barnette, Acie Fredrickson, FNP    Family History Family History  Problem Relation Age of Onset   Hypertension Mother    Cancer Father     Social History Social History   Tobacco Use   Smoking status: Never   Smokeless tobacco: Never  Vaping Use   Vaping Use: Never used  Substance Use Topics   Alcohol use: Yes   Drug use: Never     Allergies   Aspirin   Review of Systems Review of Systems Per HPI  Physical  Exam Triage Vital Signs ED Triage Vitals [07/31/22 1728]  Enc Vitals Group     BP 131/87     Pulse Rate 78     Resp 18     Temp 98.1 F (36.7 C)     Temp Source Oral     SpO2 95 %     Weight      Height      Head Circumference      Peak Flow      Pain Score 5     Pain Loc      Pain Edu?      Excl. in GC?    No data found.  Updated Vital Signs BP 131/87 (BP Location: Left Arm)   Pulse 78   Temp 98.1 F (36.7 C) (Oral)   Resp 18   SpO2 95%   Visual Acuity Right Eye Distance:   Left Eye Distance:   Bilateral Distance:    Right Eye Near:   Left Eye Near:    Bilateral Near:     Physical Exam Constitutional:      General: He is not in acute distress.    Appearance: Normal appearance. He is not toxic-appearing or diaphoretic.  HENT:     Head: Normocephalic  and atraumatic.  Eyes:     Extraocular Movements: Extraocular movements intact.     Conjunctiva/sclera: Conjunctivae normal.  Cardiovascular:     Rate and Rhythm: Normal rate and regular rhythm.     Pulses: Normal pulses.     Heart sounds: Normal heart sounds.  Pulmonary:     Effort: Pulmonary effort is normal. No respiratory distress.     Breath sounds: Normal breath sounds.  Abdominal:     General: Bowel sounds are normal. There is no distension.     Palpations: Abdomen is soft.     Tenderness: There is abdominal tenderness in the left lower quadrant.     Comments: Significant tenderness to palpation to left lower quadrant.  Neurological:     General: No focal deficit present.     Mental Status: He is alert and oriented to person, place, and time. Mental status is at baseline.  Psychiatric:        Mood and Affect: Mood normal.        Behavior: Behavior normal.        Thought Content: Thought content normal.        Judgment: Judgment normal.      UC Treatments / Results  Labs (all labs ordered are listed, but only abnormal results are displayed) Labs Reviewed - No data to  display  EKG   Radiology CT ABDOMEN PELVIS W CONTRAST  Result Date: 07/30/2022 CLINICAL DATA:  Left lower quadrant abdominal pain. EXAM: CT ABDOMEN AND PELVIS WITH CONTRAST TECHNIQUE: Multidetector CT imaging of the abdomen and pelvis was performed using the standard protocol following bolus administration of intravenous contrast. RADIATION DOSE REDUCTION: This exam was performed according to the departmental dose-optimization program which includes automated exposure control, adjustment of the mA and/or kV according to patient size and/or use of iterative reconstruction technique. CONTRAST:  13mL OMNIPAQUE IOHEXOL 350 MG/ML SOLN COMPARISON:  CT examination dated November 25, 2021 FINDINGS: Lower chest: No acute abnormality. Hepatobiliary: No focal liver abnormality is seen. No gallstones, gallbladder wall thickening, or biliary dilatation. Pancreas: Unremarkable. No pancreatic ductal dilatation or surrounding inflammatory changes. Spleen: Normal in size without focal abnormality. Adrenals/Urinary Tract: Adrenal glands are unremarkable. Kidneys are normal, without renal calculi, focal lesion, or hydronephrosis. Bladder is unremarkable. Stomach/Bowel: Stomach is within normal limits. Appendix appears normal. Colonic diverticulosis prominent in the descending and sigmoid colon. Focal short-segment descending colonic wall thickening and adjacent fat stranding consistent with acute diverticulitis. No adjacent fluid collection or abscess. No extraluminal free air. Vascular/Lymphatic: No significant vascular findings are present. No enlarged abdominal or pelvic lymph nodes. Reproductive: Prostate is unremarkable. Other: No abdominal wall hernia or abnormality. No abdominopelvic ascites. 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 No acute osseous abnormality. IMPRESSION: 1. Colonic diverticulosis with acute distal descending colonic diverticulitis without evidence of adjacent fluid  collection or abscess. 2. No evidence of bowel obstruction. Normal appendix. 3. 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 Electronically Signed   By: Larose Hires D.O.   On: 07/30/2022 23:12    Procedures Procedures (including critical care time)  Medications Ordered in UC Medications - No data to display  Initial Impression / Assessment and Plan / UC Course  I have reviewed the triage vital signs and the nursing notes.  Pertinent labs & imaging results that were available during my care of the patient were reviewed by me and considered in my medical decision making (see chart for details).     Patient had  CT scan at that ED visit that showed: 1. Colonic diverticulosis with acute distal descending colonic diverticulitis without evidence of adjacent fluid collection or abscess. 2. No evidence of bowel obstruction. Normal appendix.   Blood work was unremarkable.  After further review of patient's chart, it appears the patient has a history of diverticulitis and has had resolution of symptoms with antibiotics.  Will treat with Augmentin.  Will send Augmentin every 8 hours for 10 days per best evidence.  Also prescribed oxycodone to help alleviate discomfort.  PDMP reviewed. Patient advised to use it sparingly and that it can cause drowsiness.  He was also advised to not drink alcohol or drive while taking the medication.  Patient was given strict ER precautions and advised to go straight to the ER if symptoms persist or worsen.  Patient verbalized understanding and was agreeable with plan. Final Clinical Impressions(s) / UC Diagnoses   Final diagnoses:  Acute diverticulitis     Discharge Instructions      You have diverticulitis which is being treated with an antibiotic.  I have also prescribed you pain medication to take as needed.  Please use this sparingly as it can cause drowsiness.  Do not drive or drink alcohol while taking this.  Follow-up in the ER if symptoms persist or  worsen.    ED Prescriptions     Medication Sig Dispense Auth. Provider   amoxicillin-clavulanate (AUGMENTIN) 875-125 MG tablet Take 1 tablet by mouth every 8 (eight) hours for 10 days. 30 tablet [place], [place], [place]   oxyCODONE-acetaminophen (PERCOCET/ROXICET) 5-325 MG tablet Take 1 tablet by mouth every 6 (six) hours as needed for severe pain. 15 tablet [place], [place] [address], [place]      I have reviewed the PDMP during this encounter.   Gustavus Bryant, [place] 07/31/22 1810    Gustavus Bryant, [place] 07/31/22 1811

## 2022-08-01 NOTE — Progress Notes (Unsigned)
Subjective:    Marcus Thompson - 46 y.o. male MRN [id number]  Date of birth: 1976/04/17  HPI  Marcus Thompson is to establish care. He is accompanied by his wife. He is from [place].   Current issues and/or concerns: 07/31/2022 [facility] per NP note: Patient had CT scan at that ED visit that showed: 1. Colonic diverticulosis with acute distal descending colonic diverticulitis without evidence of adjacent fluid collection or abscess. 2. No evidence of bowel obstruction. Normal appendix.    Blood work was unremarkable.  After further review of patient's chart, it appears the patient has a history of diverticulitis and has had resolution of symptoms with antibiotics.  Will treat with Augmentin.  Will send Augmentin every 8 hours for 10 days per best evidence.  Also prescribed oxycodone to help alleviate discomfort.  PDMP reviewed. Patient advised to use it sparingly and that it can cause drowsiness.  He was also advised to not drink alcohol or drive while taking the medication.  Patient was given strict ER precautions and advised to go straight to the ER if symptoms persist or worsen.  Patient verbalized understanding and was agreeable with plan.  You have diverticulitis which is being treated with an antibiotic.  I have also prescribed you pain medication to take as needed.  Please use this sparingly as it can cause drowsiness.  Do not drive or drink alcohol while taking this.  Follow-up in the ER if symptoms persist or worsen.   Today's visit 08/04/2022: - Feeling improved since urgent care visit. Taking Augmentin as prescribed.  - Needs refills on Albuterol for asthma management. Trigger tends to be dust.  - Nodule of left nasolabial fold x 5 years. Since then growing in size. Endorses pain. - No further issues/concerns.    Physical Exam HENT:     Head: Normocephalic and atraumatic.  Eyes:     Extraocular Movements: Extraocular movements intact.      Conjunctiva/sclera: Conjunctivae normal.     Pupils: Pupils are equal, round, and reactive to light.  Cardiovascular:     Rate and Rhythm: Normal rate and regular rhythm.     Pulses: Normal pulses.     Heart sounds: Normal heart sounds.  Pulmonary:     Effort: Pulmonary effort is normal.     Breath sounds: Normal breath sounds.  Musculoskeletal:     Cervical back: Normal range of motion and neck supple.  Skin:    Comments: Firm skin nodule of left nasolabial fold. No evidence of drainage.  Neurological:     General: No focal deficit present.     Mental Status: He is alert and oriented to person, place, and time.  Psychiatric:        Mood and Affect: Mood normal.        Behavior: Behavior normal.    ROS per HPI     Health Maintenance:  Health Maintenance Due  Topic Date Due  . COVID-19 Vaccine (1) Never done  . HIV Screening  Never done  . Hepatitis C Screening  Never done  . COLONOSCOPY (Pts 45-71yrs Insurance coverage will need to be confirmed)  Never done     Past Medical History: There are no problems to display for this patient.    Social History   reports that he has never smoked. He has never been exposed to tobacco smoke. He has never used smokeless tobacco. He reports current alcohol use. He reports that he does not use drugs.  Family History  family history includes Cancer in his father; Hypertension in his mother.   Medications: reviewed and updated   Objective:   Physical Exam BP 133/89   Pulse 74   Temp 98.3 F (36.8 C)   Resp 16   Ht 5' 6.14" (1.68 m)   Wt 198 lb (89.8 kg)   SpO2 93%   BMI 31.82 kg/m   Physical Exam HENT:     Head: Normocephalic and atraumatic.  Eyes:     Extraocular Movements: Extraocular movements intact.     Conjunctiva/sclera: Conjunctivae normal.     Pupils: Pupils are equal, round, and reactive to light.  Cardiovascular:     Rate and Rhythm: Normal rate and regular rhythm.     Pulses: Normal pulses.     Heart  sounds: Normal heart sounds.  Pulmonary:     Effort: Pulmonary effort is normal.     Breath sounds: Normal breath sounds.  Musculoskeletal:     Cervical back: Normal range of motion and neck supple.  Skin:    Comments: Firm skin nodule of left nasolabial fold. No evidence of drainage.  Neurological:     General: No focal deficit present.     Mental Status: He is alert and oriented to person, place, and time.  Psychiatric:        Mood and Affect: Mood normal.        Behavior: Behavior normal.       Assessment & Plan:  1. Encounter to establish care - Patient presents today to establish care.  - Return for annual physical examination, labs, and health maintenance. Arrive fasting meaning having no food for at least 8 hours prior to appointment. You may have only water or black coffee. Please take scheduled medications as normal.  2. Diverticulitis 3. Colon cancer screening 4. History of small bowel obstruction - Continue present management.  - Referral to Gastroenterology for further evaluation/management.  - Ambulatory referral to Gastroenterology  5. Mild intermittent asthma, unspecified whether complicated - Continue Albuterol inhaler as prescribed.  - Follow-up with primary provider as scheduled. - albuterol (VENTOLIN HFA) 108 (90 Base) MCG/ACT inhaler; Inhale 2 puffs into the lungs every 6 (six) hours as needed for wheezing or shortness of breath.  Dispense: 8 g; Refill: 1  6. Skin nodule - Referral to Dermatology for further evaluation/management.  - Ambulatory referral to Dermatology    Patient was given clear instructions to go to Emergency Department or return to medical center if symptoms don't improve, worsen, or new problems develop.The patient verbalized understanding.  I discussed the assessment and treatment plan with the patient. The patient was provided an opportunity to ask questions and all were answered. The patient agreed with the plan and demonstrated an  understanding of the instructions.   The patient was advised to call back or seek an in-person evaluation if the symptoms worsen or if the condition fails to improve as anticipated.    Ricky Stabs, NP 08/04/2022, 1:27 PM Primary Care at [facility]

## 2022-08-04 ENCOUNTER — Ambulatory Visit (INDEPENDENT_AMBULATORY_CARE_PROVIDER_SITE_OTHER): Payer: Commercial Managed Care - HMO | Admitting: Family

## 2022-08-04 ENCOUNTER — Encounter: Payer: Self-pay | Admitting: Family

## 2022-08-04 VITALS — BP 133/89 | HR 74 | Temp 98.3°F | Resp 16 | Ht 66.14 in | Wt 198.0 lb

## 2022-08-04 DIAGNOSIS — K5792 Diverticulitis of intestine, part unspecified, without perforation or abscess without bleeding: Secondary | ICD-10-CM

## 2022-08-04 DIAGNOSIS — R229 Localized swelling, mass and lump, unspecified: Secondary | ICD-10-CM

## 2022-08-04 DIAGNOSIS — Z1211 Encounter for screening for malignant neoplasm of colon: Secondary | ICD-10-CM

## 2022-08-04 DIAGNOSIS — J4520 Mild intermittent asthma, uncomplicated: Secondary | ICD-10-CM | POA: Diagnosis not present

## 2022-08-04 DIAGNOSIS — Z8719 Personal history of other diseases of the digestive system: Secondary | ICD-10-CM | POA: Diagnosis not present

## 2022-08-04 DIAGNOSIS — Z7689 Persons encountering health services in other specified circumstances: Secondary | ICD-10-CM

## 2022-08-04 MED ORDER — ALBUTEROL SULFATE HFA 108 (90 BASE) MCG/ACT IN AERS: 2.0000 | INHALATION_SPRAY | Freq: Four times a day (QID) | RESPIRATORY_TRACT | 1 refills | Status: AC | PRN

## 2022-08-04 NOTE — Progress Notes (Signed)
.  Pt presents to establish care,  -needs referral to dermatology for skin acne  -needs referral to GI for diverticulitis  -albuterol inhaler requested

## 2022-08-04 NOTE — Patient Instructions (Signed)
Thank you for choosing Primary Care at [facility] for your medical home!    Marcus Thompson was seen by Rema Fendt, NP today.   Bransyn Carnegie's primary care provider is Rema Fendt, NP.   For the best care possible,  you should try to see Ricky Stabs, NP whenever you come to office.   We look forward to seeing you again soon!  If you have any questions about your visit today,  please call us at [phone]  Or feel free to reach your provider via MyChart.   Keeping you healthy   Get these tests Blood pressure- Have your blood pressure checked once a year by your healthcare provider.  Normal blood pressure is 120/80. Weight- Have your body mass index (BMI) calculated to screen for obesity.  BMI is a measure of body fat based on height and weight. You can also calculate your own BMI at https://www.west-esparza.com/. Cholesterol- Have your cholesterol checked regularly starting at age 54, sooner may be necessary if you have diabetes, high blood pressure, if a family member developed heart diseases at an early age or if you smoke.  Chlamydia, HIV, and other sexual transmitted disease- Get screened each year until the age of 8 then within three months of each new sexual partner. Diabetes- Have your blood sugar checked regularly if you have high blood pressure, high cholesterol, a family history of diabetes or if you are overweight.   Get these vaccines Flu shot- Every fall. Tetanus shot- Every 10 years. Menactra- Single dose; prevents meningitis.   Take these steps Don't smoke- If you do smoke, ask your healthcare provider about quitting. For tips on how to quit, go to www.smokefree.gov or call 1-800-QUIT-NOW. Be physically active- Exercise 5 days a week for at least 30 minutes.  If you are not already physically active start slow and gradually work up to 30 minutes of moderate physical activity.  Examples of moderate activity include walking briskly, mowing the yard, dancing,  swimming bicycling, etc. Eat a healthy diet- Eat a variety of healthy foods such as fruits, vegetables, low fat milk, low fat cheese, yogurt, lean meats, poultry, fish, beans, tofu, etc.  For more information on healthy eating, go to www.thenutritionsource.org Drink alcohol in moderation- Limit alcohol intake two drinks or less a day.  Never drink and drive. Dentist- Brush and floss teeth twice daily; visit your dentis twice a year. Depression-Your emotional health is as important as your physical health.  If you're feeling down, losing interest in things you normally enjoy please talk with your healthcare provider. Gun Safety- If you keep a gun in your home, keep it unloaded and with the safety lock on.  Bullets should be stored separately. Helmet use- Always wear a helmet when riding a motorcycle, bicycle, rollerblading or skateboarding. Safe sex- If you may be exposed to a sexually transmitted infection, use a condom Seat belts- Seat bels can save your life; always wear one. Smoke/Carbon Monoxide detectors- These detectors need to be installed on the appropriate level of your home.  Replace batteries at least once a year. Skin Cancer- When out in the sun, cover up and use sunscreen SPF 15 or higher. Violence- If anyone is threatening or hurting you, please tell your healthcare provider.

## 2022-08-25 DIAGNOSIS — D72829 Elevated white blood cell count, unspecified: Secondary | ICD-10-CM | POA: Diagnosis not present

## 2022-08-25 DIAGNOSIS — E876 Hypokalemia: Secondary | ICD-10-CM | POA: Diagnosis not present

## 2022-08-25 DIAGNOSIS — E871 Hypo-osmolality and hyponatremia: Secondary | ICD-10-CM | POA: Diagnosis not present

## 2022-08-25 DIAGNOSIS — Z419 Encounter for procedure for purposes other than remedying health state, unspecified: Secondary | ICD-10-CM | POA: Diagnosis not present

## 2022-08-25 DIAGNOSIS — R109 Unspecified abdominal pain: Secondary | ICD-10-CM | POA: Diagnosis not present

## 2022-08-25 DIAGNOSIS — Z20822 Contact with and (suspected) exposure to covid-19: Secondary | ICD-10-CM | POA: Diagnosis not present

## 2022-08-25 DIAGNOSIS — A419 Sepsis, unspecified organism: Secondary | ICD-10-CM | POA: Diagnosis not present

## 2022-08-25 DIAGNOSIS — N390 Urinary tract infection, site not specified: Secondary | ICD-10-CM | POA: Diagnosis not present

## 2022-08-25 DIAGNOSIS — R319 Hematuria, unspecified: Secondary | ICD-10-CM | POA: Diagnosis not present

## 2022-08-25 DIAGNOSIS — E860 Dehydration: Secondary | ICD-10-CM | POA: Diagnosis not present

## 2022-08-26 DIAGNOSIS — A419 Sepsis, unspecified organism: Secondary | ICD-10-CM | POA: Diagnosis not present

## 2022-08-26 DIAGNOSIS — N390 Urinary tract infection, site not specified: Secondary | ICD-10-CM | POA: Diagnosis not present

## 2022-08-27 DIAGNOSIS — N390 Urinary tract infection, site not specified: Secondary | ICD-10-CM | POA: Diagnosis not present

## 2022-08-27 DIAGNOSIS — B9620 Unspecified Escherichia coli [E. coli] as the cause of diseases classified elsewhere: Secondary | ICD-10-CM | POA: Diagnosis not present

## 2022-08-28 ENCOUNTER — Telehealth: Payer: Self-pay

## 2022-08-28 NOTE — Telephone Encounter (Signed)
Transition Care Management Unsuccessful Follow-up Telephone Call  Date of discharge and from where:  [facility] 08/27/2022  Attempts:  1st Attempt  Reason for unsuccessful TCM follow-up call:  Voice mail full Juanda Crumble, [place] Direct Dial [phone]

## 2022-08-29 NOTE — Telephone Encounter (Signed)
Transition Care Management Unsuccessful Follow-up Telephone Call  Date of discharge and from where:  [facility] 08/27/2022  Attempts:  2nd Attempt  Reason for unsuccessful TCM follow-up call:  Left voice message Juanda Crumble, [place] Direct Dial [phone]

## 2022-09-01 NOTE — Telephone Encounter (Signed)
Transition Care Management Unsuccessful Follow-up Telephone Call  Date of discharge and from where:  [facility] 08/27/2022  Attempts:  3rd Attempt  Reason for unsuccessful TCM follow-up call:  Left voice message Juanda Crumble, [place] Direct Dial [phone]

## 2022-09-15 DIAGNOSIS — R1032 Left lower quadrant pain: Secondary | ICD-10-CM | POA: Diagnosis not present

## 2022-09-15 DIAGNOSIS — K5732 Diverticulitis of large intestine without perforation or abscess without bleeding: Secondary | ICD-10-CM | POA: Diagnosis not present

## 2022-09-16 DIAGNOSIS — R1032 Left lower quadrant pain: Secondary | ICD-10-CM | POA: Diagnosis not present

## 2022-09-22 ENCOUNTER — Ambulatory Visit: Payer: Self-pay

## 2022-09-22 DIAGNOSIS — Y999 Unspecified external cause status: Secondary | ICD-10-CM | POA: Diagnosis not present

## 2022-09-22 DIAGNOSIS — K5792 Diverticulitis of intestine, part unspecified, without perforation or abscess without bleeding: Secondary | ICD-10-CM | POA: Diagnosis not present

## 2022-09-22 DIAGNOSIS — T7840XA Allergy, unspecified, initial encounter: Secondary | ICD-10-CM | POA: Diagnosis not present

## 2022-09-22 DIAGNOSIS — X58XXXA Exposure to other specified factors, initial encounter: Secondary | ICD-10-CM | POA: Diagnosis not present

## 2022-09-22 NOTE — Telephone Encounter (Signed)
  Chief Complaint: Wide spread rash and itch Symptoms: Red rash - very itchy Frequency: Yesterday Pertinent Negatives: Patient denies fever Disposition: [x] ED /[] Urgent Care (no appt availability in office) / [] Appointment(In office/virtual)/ []  [facility]/ [] Home Care/ [] Refused Recommended Disposition /[] [facility]/ []  Follow-up with PCP Additional Notes: PT will go to ED for care. PT started 2 new medications given to him at the hospital when he was there for diverticulitis.     Reason for Disposition  SEVERE itching (i.e., interferes with sleep, normal activities or school)  Answer Assessment - Initial Assessment Questions 1. APPEARANCE of RASH: "Describe the rash." (e.g., spots, blisters, raised areas, skin peeling, scaly)     All over 2. SIZE: "How big are the spots?" (e.g., tip of pen, eraser, coin; inches, centimeters)     Dots -  3. LOCATION: "Where is the rash located?"     All over 4. COLOR: "What color is the rash?" (Note: It is difficult to assess rash color in people with darker-colored skin. When this situation occurs, simply ask the caller to describe what they see.)     red 5. ONSET: "When did the rash begin?"     Before yesterday - last night became itchy. Rash  6. FEVER: "Do you have a fever?" If Yes, ask: "What is your temperature, how was it measured, and when did it start?"     no 7. ITCHING: "Does the rash itch?" If Yes, ask: "How bad is the itch?" (Scale 1-10; or mild, moderate, severe)     Yes - 10/10 8. CAUSE: "What do you think is causing the rash?"     Unsure - possible latex 9. MEDICINE FACTORS: "Have you started any new medicines within the last 2 weeks?" (e.g., antibiotics)      no 10. OTHER SYMPTOMS: "Do you have any other symptoms?" (e.g., dizziness, headache, sore throat, joint pain)       no 11. PREGNANCY: "Is there any chance you are pregnant?" "When was your last menstrual period?"  Protocols used: Rash or Redness -  [facility]

## 2022-09-23 NOTE — Progress Notes (Deleted)
Patient ID: Marcus Thompson, male    DOB: 04-06-1976  MRN: [id number]  CC: Annual Physical Exam  Subjective: Marcus Thompson is a 47 y.o. male who presents for annual physical exam.  His concerns today include:  09/15/2022 and 09/22/2022 Emergency [facility] per MD: Diverticulitis and allergic reaction Please take benadryl '25mg'$  up to three times per day as needed for rash and itching. Stop taking augmentin.   Gastro - colon ca and diverticulitis  Referred to [facility] 08/04/2022 for the same   There are no problems to display for this patient.    Current Outpatient Medications on File Prior to Visit  Medication Sig Dispense Refill   albuterol (VENTOLIN HFA) 108 (90 Base) MCG/ACT inhaler Inhale 2 puffs into the lungs every 6 (six) hours as needed for wheezing or shortness of breath. 8 g 1   oxyCODONE-acetaminophen (PERCOCET/ROXICET) 5-325 MG tablet Take 1 tablet by mouth every 6 (six) hours as needed for severe pain. 15 tablet 0   No current facility-administered medications on file prior to visit.    Allergies  Allergen Reactions   Aspirin Anaphylaxis    Social History   Socioeconomic History   Marital status: Married    Spouse name: Not on file   Number of children: Not on file   Years of education: Not on file   Highest education level: Not on file  Occupational History   Not on file  Tobacco Use   Smoking status: Never    Passive exposure: Never   Smokeless tobacco: Never  Vaping Use   Vaping Use: Never used  Substance and Sexual Activity   Alcohol use: Yes    Comment: socially   Drug use: Never   Sexual activity: Yes  Other Topics Concern   Not on file  Social History Narrative   Not on file   Social Determinants of Health   Financial Resource Strain: Not on file  Food Insecurity: Not on file  Transportation Needs: Not on file  Physical Activity: Not on file  Stress: Not on file  Social Connections: Not on file  Intimate Partner Violence:  Not on file    Family History  Problem Relation Age of Onset   Hypertension Mother    Cancer Father     Past Surgical History:  Procedure Laterality Date   ABDOMINAL SURGERY     APPENDECTOMY     HERNIA REPAIR      ROS: Review of Systems Negative except as stated above  PHYSICAL EXAM: There were no vitals taken for this visit.  Physical Exam  {male adult master:310786} {male adult master:310785}     Latest Ref Rng & Units 07/30/2022    8:24 PM 11/25/2021   11:59 AM 10/28/2019   11:45 AM  CMP  Glucose 70 - 99 mg/dL 104  107  108   BUN 6 - 20 mg/dL '14  14  12   '$ Creatinine 0.61 - 1.24 mg/dL 0.91  0.81  0.76   Sodium 135 - 145 mmol/L 138  135  140   Potassium 3.5 - 5.1 mmol/L 3.8  3.7  4.0   Chloride 98 - 111 mmol/L 105  103  106   CO2 22 - 32 mmol/L '25  27  25   '$ Calcium 8.9 - 10.3 mg/dL 9.1  8.9  8.9   Total Protein 6.5 - 8.1 g/dL 7.5  7.7  7.8   Total Bilirubin 0.3 - 1.2 mg/dL 0.9  0.8  0.8  Alkaline Phos 38 - 126 U/L 41  46  39   AST 15 - 41 U/L '18  16  28   '$ ALT 0 - 44 U/L 21  26  39    Lipid Panel  No results found for: "CHOL", "TRIG", "HDL", "CHOLHDL", "VLDL", "LDLCALC", "LDLDIRECT"  CBC    Component Value Date/Time   WBC 11.0 (H) 07/30/2022 2024   RBC 4.67 07/30/2022 2024   HGB 15.0 07/30/2022 2024   HCT 43.6 07/30/2022 2024   PLT 223 07/30/2022 2024   MCV 93.4 07/30/2022 2024   MCH 32.1 07/30/2022 2024   MCHC 34.4 07/30/2022 2024   RDW 12.4 07/30/2022 2024   LYMPHSABS 2.2 07/30/2022 2024   MONOABS 1.6 (H) 07/30/2022 2024   EOSABS 0.1 07/30/2022 2024   BASOSABS 0.1 07/30/2022 2024    ASSESSMENT AND PLAN:  There are no diagnoses linked to this encounter.   Patient was given the opportunity to ask questions.  Patient verbalized understanding of the plan and was able to repeat key elements of the plan. Patient was given clear instructions to go to Emergency Department or return to medical center if symptoms don't improve, worsen, or new problems  develop.The patient verbalized understanding.   No orders of the defined types were placed in this encounter.    Requested Prescriptions    No prescriptions requested or ordered in this encounter    No follow-ups on file.  Marcus Herter, NP

## 2022-09-25 DIAGNOSIS — Z419 Encounter for procedure for purposes other than remedying health state, unspecified: Secondary | ICD-10-CM | POA: Diagnosis not present

## 2022-09-26 ENCOUNTER — Encounter: Payer: Commercial Managed Care - HMO | Admitting: Family

## 2022-09-26 DIAGNOSIS — Z0000 Encounter for general adult medical examination without abnormal findings: Secondary | ICD-10-CM

## 2022-09-26 DIAGNOSIS — Z1211 Encounter for screening for malignant neoplasm of colon: Secondary | ICD-10-CM

## 2022-09-26 DIAGNOSIS — K5792 Diverticulitis of intestine, part unspecified, without perforation or abscess without bleeding: Secondary | ICD-10-CM

## 2022-09-26 DIAGNOSIS — Z1159 Encounter for screening for other viral diseases: Secondary | ICD-10-CM

## 2022-09-26 DIAGNOSIS — Z1329 Encounter for screening for other suspected endocrine disorder: Secondary | ICD-10-CM

## 2022-09-26 DIAGNOSIS — Z131 Encounter for screening for diabetes mellitus: Secondary | ICD-10-CM

## 2022-09-26 DIAGNOSIS — Z114 Encounter for screening for human immunodeficiency virus [HIV]: Secondary | ICD-10-CM

## 2022-09-26 DIAGNOSIS — Z13220 Encounter for screening for lipoid disorders: Secondary | ICD-10-CM

## 2022-10-07 DIAGNOSIS — Z0000 Encounter for general adult medical examination without abnormal findings: Secondary | ICD-10-CM | POA: Diagnosis not present

## 2022-10-07 DIAGNOSIS — Z1211 Encounter for screening for malignant neoplasm of colon: Secondary | ICD-10-CM | POA: Diagnosis not present

## 2022-10-07 DIAGNOSIS — Z6830 Body mass index (BMI) 30.0-30.9, adult: Secondary | ICD-10-CM | POA: Diagnosis not present

## 2022-10-07 DIAGNOSIS — K5792 Diverticulitis of intestine, part unspecified, without perforation or abscess without bleeding: Secondary | ICD-10-CM | POA: Diagnosis not present

## 2022-10-07 DIAGNOSIS — Z2821 Immunization not carried out because of patient refusal: Secondary | ICD-10-CM | POA: Diagnosis not present

## 2022-10-16 DIAGNOSIS — L819 Disorder of pigmentation, unspecified: Secondary | ICD-10-CM | POA: Diagnosis not present

## 2022-10-16 DIAGNOSIS — R220 Localized swelling, mass and lump, head: Secondary | ICD-10-CM | POA: Diagnosis not present

## 2022-10-24 DIAGNOSIS — Z419 Encounter for procedure for purposes other than remedying health state, unspecified: Secondary | ICD-10-CM | POA: Diagnosis not present

## 2022-10-29 ENCOUNTER — Encounter: Payer: Self-pay | Admitting: Gastroenterology

## 2022-11-24 DIAGNOSIS — R220 Localized swelling, mass and lump, head: Secondary | ICD-10-CM | POA: Diagnosis not present

## 2022-11-24 DIAGNOSIS — Z419 Encounter for procedure for purposes other than remedying health state, unspecified: Secondary | ICD-10-CM | POA: Diagnosis not present

## 2022-12-02 DIAGNOSIS — D485 Neoplasm of uncertain behavior of skin: Secondary | ICD-10-CM | POA: Diagnosis not present

## 2022-12-08 DIAGNOSIS — C801 Malignant (primary) neoplasm, unspecified: Secondary | ICD-10-CM | POA: Diagnosis not present

## 2022-12-19 ENCOUNTER — Ambulatory Visit: Payer: Commercial Managed Care - HMO | Admitting: Gastroenterology

## 2023-01-24 DIAGNOSIS — Z419 Encounter for procedure for purposes other than remedying health state, unspecified: Secondary | ICD-10-CM | POA: Diagnosis not present

## 2023-02-23 ENCOUNTER — Ambulatory Visit: Payer: Medicaid Other | Admitting: Internal Medicine

## 2023-02-23 DIAGNOSIS — Z419 Encounter for procedure for purposes other than remedying health state, unspecified: Secondary | ICD-10-CM | POA: Diagnosis not present

## 2023-03-26 DIAGNOSIS — Z419 Encounter for procedure for purposes other than remedying health state, unspecified: Secondary | ICD-10-CM | POA: Diagnosis not present

## 2023-04-26 DIAGNOSIS — Z419 Encounter for procedure for purposes other than remedying health state, unspecified: Secondary | ICD-10-CM | POA: Diagnosis not present

## 2023-05-26 DIAGNOSIS — Z419 Encounter for procedure for purposes other than remedying health state, unspecified: Secondary | ICD-10-CM | POA: Diagnosis not present

## 2023-06-26 DIAGNOSIS — Z419 Encounter for procedure for purposes other than remedying health state, unspecified: Secondary | ICD-10-CM | POA: Diagnosis not present

## 2023-07-26 DIAGNOSIS — Z419 Encounter for procedure for purposes other than remedying health state, unspecified: Secondary | ICD-10-CM | POA: Diagnosis not present

## 2023-08-26 DIAGNOSIS — Z419 Encounter for procedure for purposes other than remedying health state, unspecified: Secondary | ICD-10-CM | POA: Diagnosis not present

## 2023-09-25 IMAGING — CT CT ABD-PELV W/ CM
2 of 5 series · 15 of 46 positions shown, 17 images · IV contrast (agent unspecified)
Comparison: 10/28/2019.

CLINICAL DATA: Left lower quadrant pain for 3 days.

EXAM:
CT ABDOMEN AND PELVIS WITH CONTRAST
TECHNIQUE: Multidetector CT imaging of the abdomen and pelvis was performed
using the standard protocol following bolus administration of
intravenous contrast.

[Series 2: axial st · axial · 0.75mm/px · z∈[-378,+57]mm · 12 of 101 slices shown, 14 images]
[im 7/101  soft-tissue]
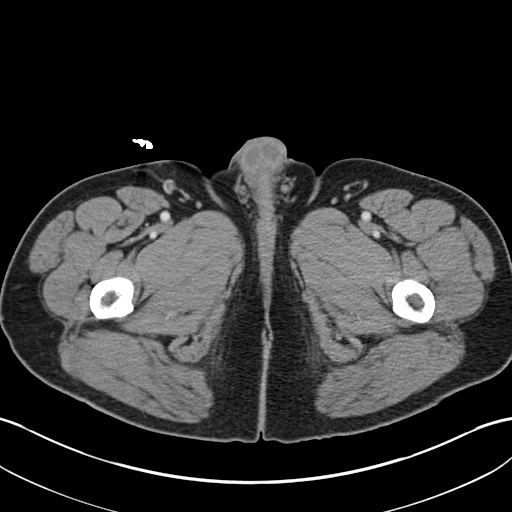
[im 7/101  bone]
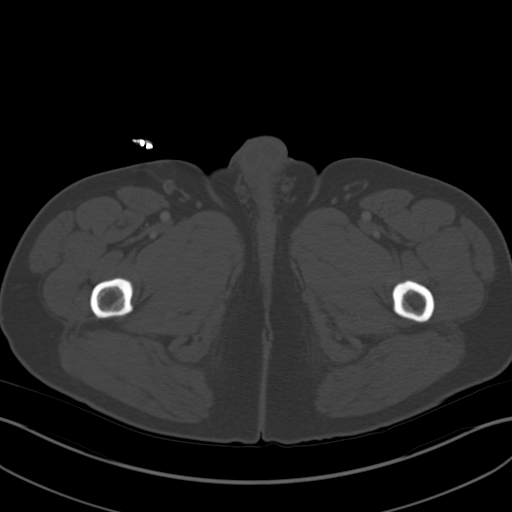
[im 13/101  soft-tissue]
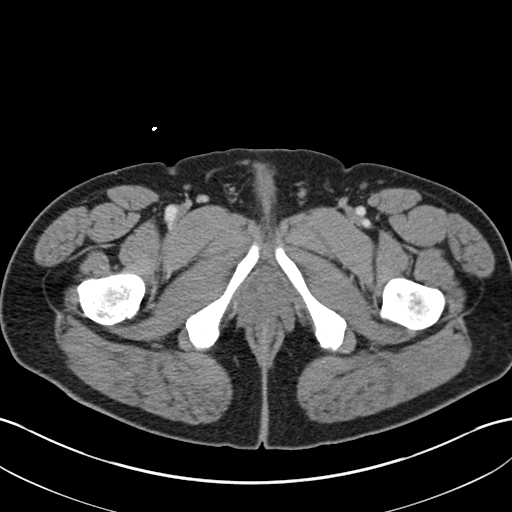
[im 26/101  soft-tissue]
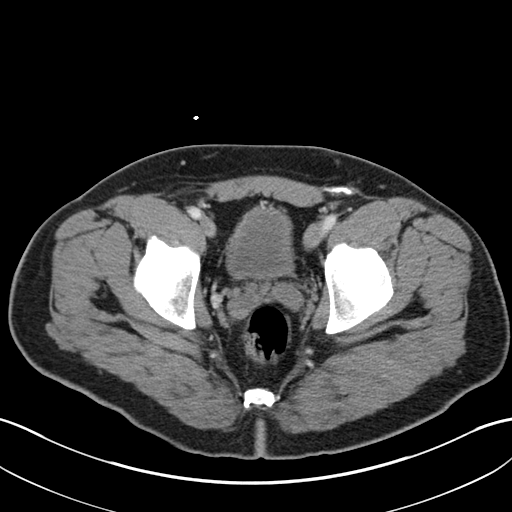
[im 32/101  soft-tissue]
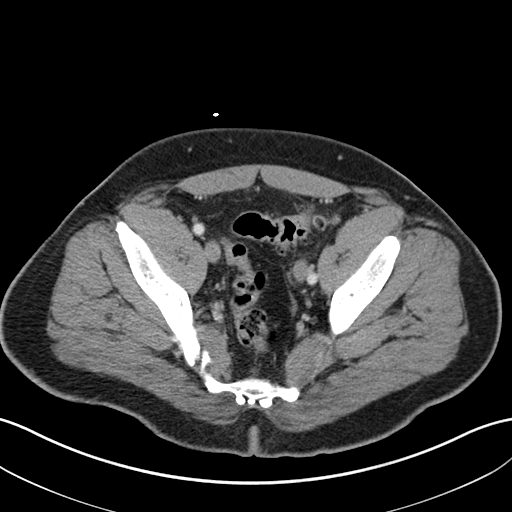
[im 38/101  soft-tissue]
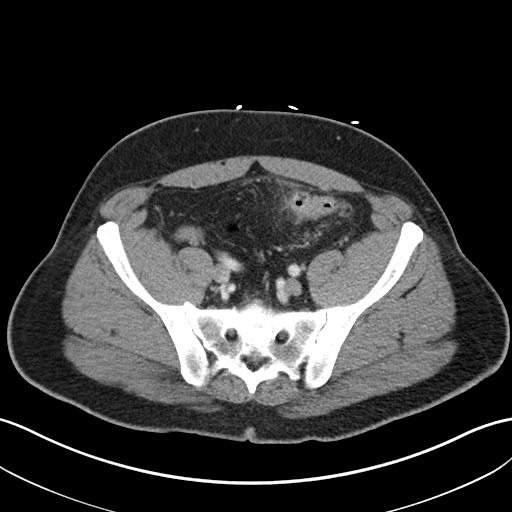
[im 44/101  soft-tissue]
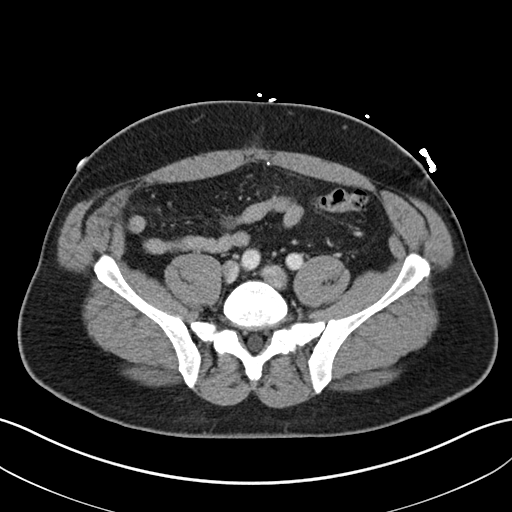
[im 57/101  soft-tissue]
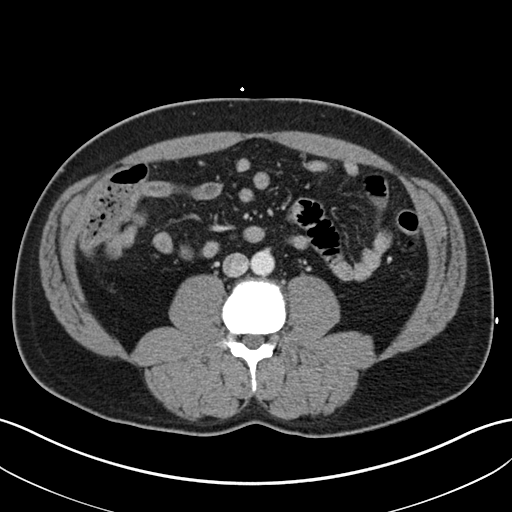
[im 63/101  soft-tissue]
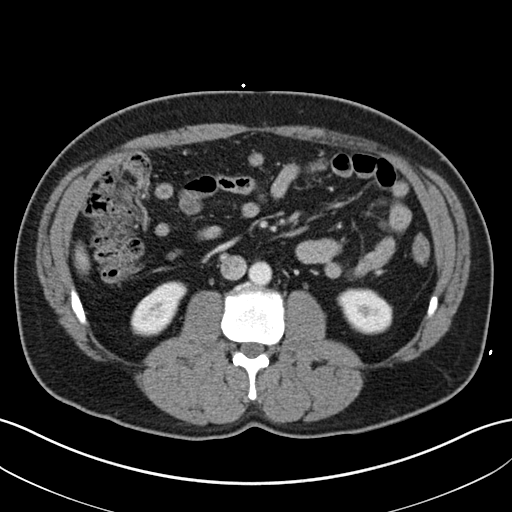
[im 69/101  soft-tissue]
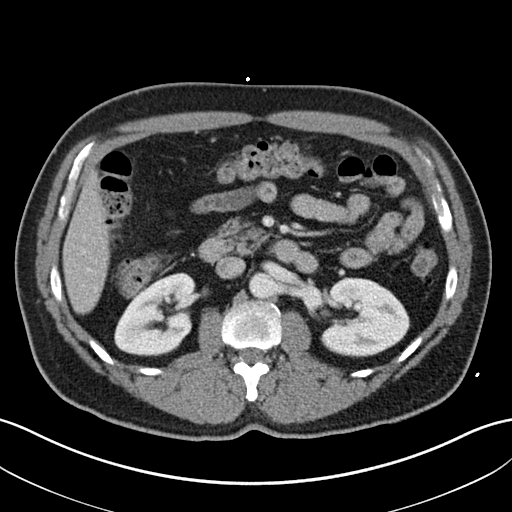
[im 69/101  bone]
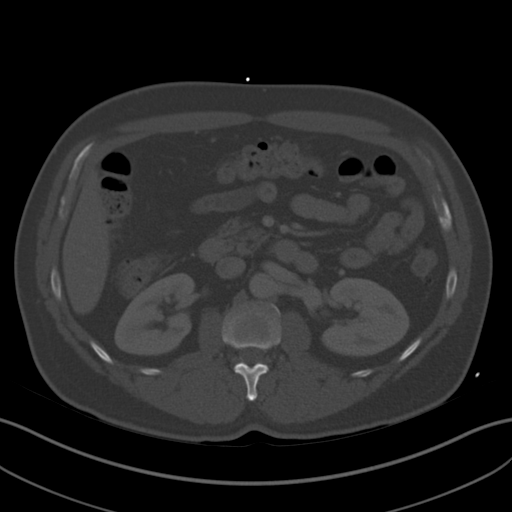
[im 76/101  soft-tissue]
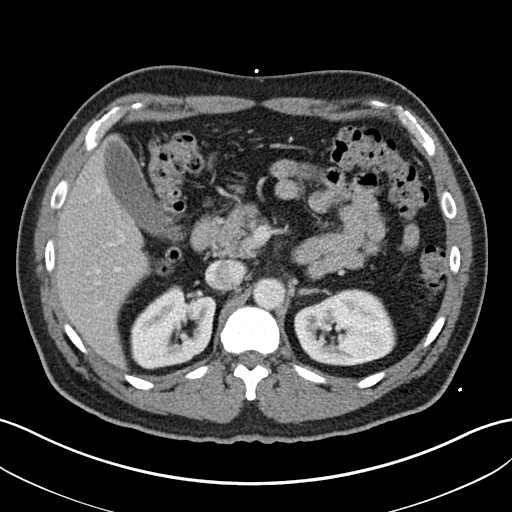
[im 88/101  soft-tissue]
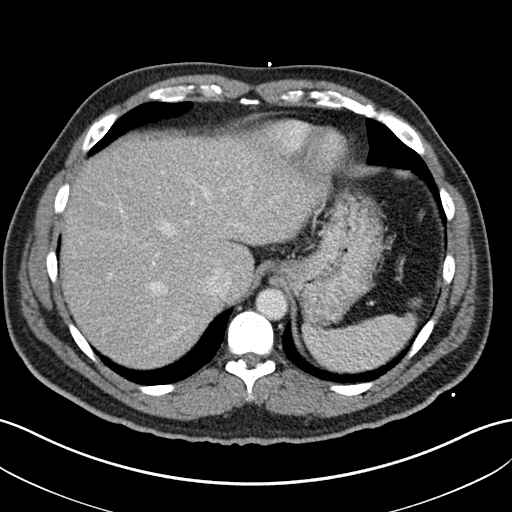
[im 94/101  soft-tissue]
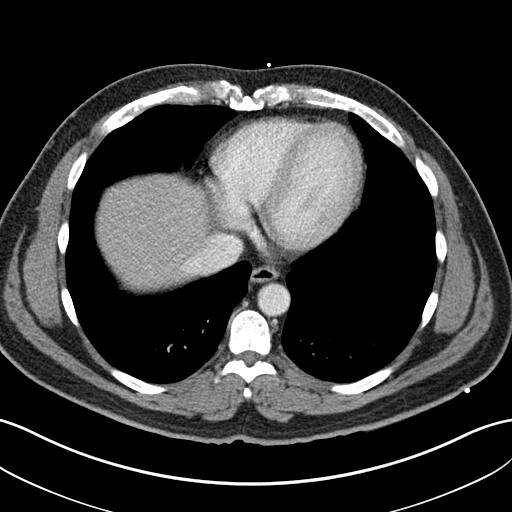

[Series 5: coronal st · coronal · 0.69mm/px · 3 of 151 slices shown]
[im 51/151  soft-tissue]
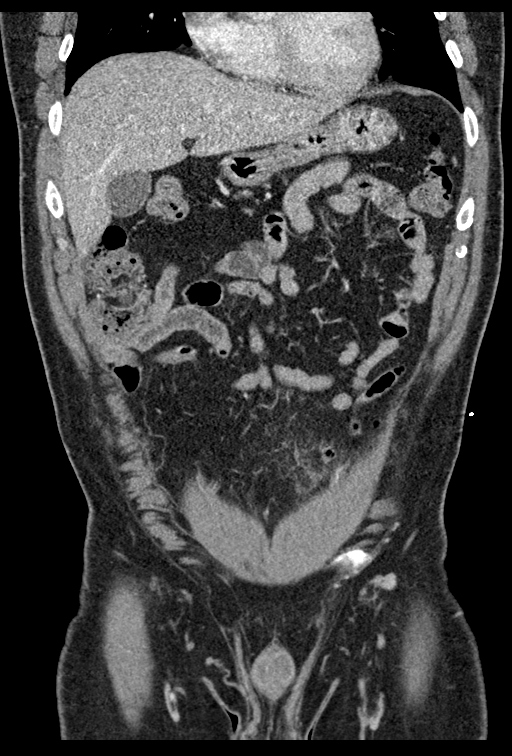
[im 67/151  soft-tissue]
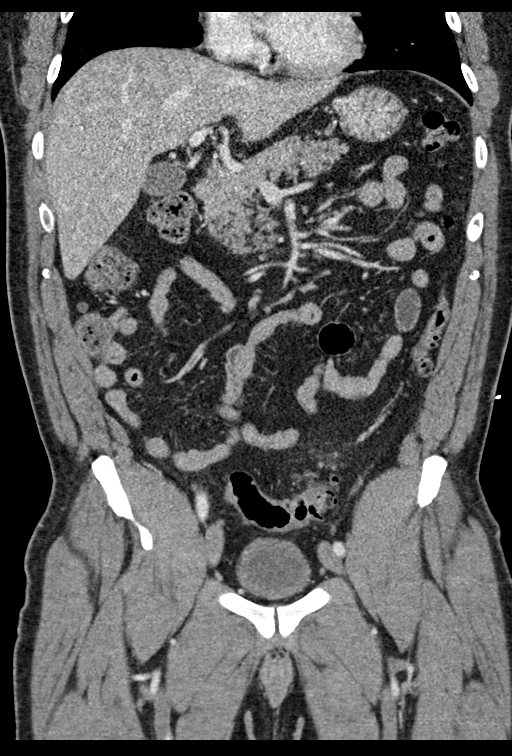
[im 84/151  soft-tissue]
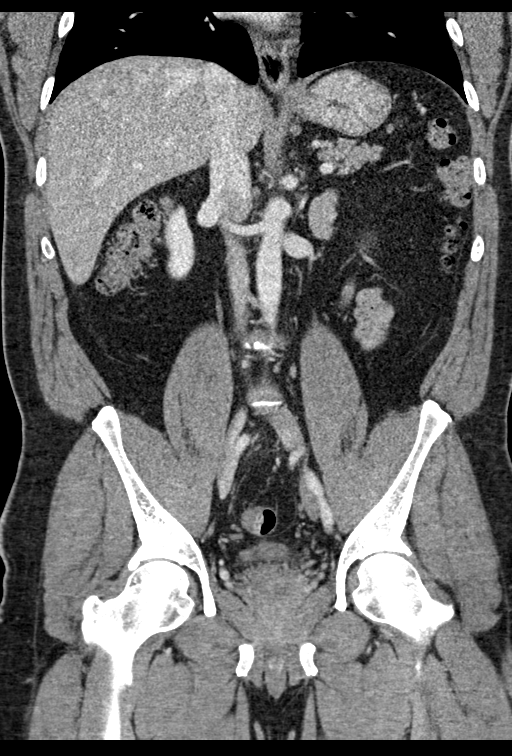

[15 of 46 positions shown; findings below may reference images not displayed]

RADIATION DOSE REDUCTION: This exam was performed according to the
departmental dose-optimization program which includes automated
exposure control, adjustment of the mA and/or kV according to
patient size and/or use of iterative reconstruction technique.

CONTRAST:  100mL OMNIPAQUE IOHEXOL 300 MG/ML  SOLN
FINDINGS: Lower chest: Lung bases are clear. Heart size normal. No pericardial
or pleural effusion. Distal esophagus is unremarkable.

Hepatobiliary: Liver may be slightly decreased in attenuation
diffusely. Liver and gallbladder are otherwise unremarkable. No
biliary ductal dilatation.

Pancreas: Negative.

Spleen: Negative.

Adrenals/Urinary Tract: Adrenal glands and kidneys are unremarkable.
Ureters are decompressed. Bladder is low in volume.

Stomach/Bowel: Stomach, small bowel, appendix and majority of the
colon are unremarkable. Wall thickening and pericolonic inflammatory
haziness/stranding involving the sigmoid colon. No extraluminal air
or organized fluid collection.

Vascular/Lymphatic: Vascular structures are unremarkable. No
pathologically enlarged lymph nodes.

Reproductive: Prostate is visualized.

Other: Small right inguinal hernia contains fat. Left inguinal
hernia repair. No free fluid. Mesenteries and peritoneum are
otherwise unremarkable.

Musculoskeletal: No worrisome lytic or sclerotic lesions.
IMPRESSION: 1. Acute uncomplicated sigmoid diverticulitis.
2. Liver may be steatotic.
3. Small right inguinal hernia contains fat.

## 2023-09-26 DIAGNOSIS — Z419 Encounter for procedure for purposes other than remedying health state, unspecified: Secondary | ICD-10-CM | POA: Diagnosis not present

## 2023-10-24 DIAGNOSIS — Z419 Encounter for procedure for purposes other than remedying health state, unspecified: Secondary | ICD-10-CM | POA: Diagnosis not present

## 2023-11-02 DIAGNOSIS — R079 Chest pain, unspecified: Secondary | ICD-10-CM | POA: Diagnosis not present

## 2023-11-02 DIAGNOSIS — R0789 Other chest pain: Secondary | ICD-10-CM | POA: Diagnosis not present

## 2023-11-02 DIAGNOSIS — J45909 Unspecified asthma, uncomplicated: Secondary | ICD-10-CM | POA: Diagnosis not present

## 2023-11-02 DIAGNOSIS — Z20822 Contact with and (suspected) exposure to covid-19: Secondary | ICD-10-CM | POA: Diagnosis not present

## 2023-11-02 DIAGNOSIS — R0989 Other specified symptoms and signs involving the circulatory and respiratory systems: Secondary | ICD-10-CM | POA: Diagnosis not present

## 2023-11-16 DIAGNOSIS — H5213 Myopia, bilateral: Secondary | ICD-10-CM | POA: Diagnosis not present

## 2023-12-05 DIAGNOSIS — Z419 Encounter for procedure for purposes other than remedying health state, unspecified: Secondary | ICD-10-CM | POA: Diagnosis not present

## 2024-01-04 DIAGNOSIS — Z419 Encounter for procedure for purposes other than remedying health state, unspecified: Secondary | ICD-10-CM | POA: Diagnosis not present

## 2024-01-06 DIAGNOSIS — M79604 Pain in right leg: Secondary | ICD-10-CM | POA: Diagnosis not present

## 2024-01-06 DIAGNOSIS — M79671 Pain in right foot: Secondary | ICD-10-CM | POA: Diagnosis not present

## 2024-01-06 DIAGNOSIS — Z0000 Encounter for general adult medical examination without abnormal findings: Secondary | ICD-10-CM | POA: Diagnosis not present

## 2024-01-06 DIAGNOSIS — M79672 Pain in left foot: Secondary | ICD-10-CM | POA: Diagnosis not present

## 2024-01-06 DIAGNOSIS — Z125 Encounter for screening for malignant neoplasm of prostate: Secondary | ICD-10-CM | POA: Diagnosis not present

## 2024-01-06 DIAGNOSIS — R5383 Other fatigue: Secondary | ICD-10-CM | POA: Diagnosis not present

## 2024-01-06 DIAGNOSIS — M79642 Pain in left hand: Secondary | ICD-10-CM | POA: Diagnosis not present

## 2024-01-06 DIAGNOSIS — M79605 Pain in left leg: Secondary | ICD-10-CM | POA: Diagnosis not present

## 2024-01-06 DIAGNOSIS — M79641 Pain in right hand: Secondary | ICD-10-CM | POA: Diagnosis not present

## 2024-01-06 DIAGNOSIS — R0602 Shortness of breath: Secondary | ICD-10-CM | POA: Diagnosis not present

## 2024-01-06 DIAGNOSIS — Z6832 Body mass index (BMI) 32.0-32.9, adult: Secondary | ICD-10-CM | POA: Diagnosis not present

## 2024-01-06 DIAGNOSIS — E559 Vitamin D deficiency, unspecified: Secondary | ICD-10-CM | POA: Diagnosis not present

## 2024-01-06 DIAGNOSIS — D539 Nutritional anemia, unspecified: Secondary | ICD-10-CM | POA: Diagnosis not present

## 2024-01-19 DIAGNOSIS — Z6832 Body mass index (BMI) 32.0-32.9, adult: Secondary | ICD-10-CM | POA: Diagnosis not present

## 2024-01-19 DIAGNOSIS — E6609 Other obesity due to excess calories: Secondary | ICD-10-CM | POA: Diagnosis not present

## 2024-01-19 DIAGNOSIS — M79672 Pain in left foot: Secondary | ICD-10-CM | POA: Diagnosis not present

## 2024-01-19 DIAGNOSIS — M79605 Pain in left leg: Secondary | ICD-10-CM | POA: Diagnosis not present

## 2024-01-19 DIAGNOSIS — M79642 Pain in left hand: Secondary | ICD-10-CM | POA: Diagnosis not present

## 2024-01-19 DIAGNOSIS — M79671 Pain in right foot: Secondary | ICD-10-CM | POA: Diagnosis not present

## 2024-01-19 DIAGNOSIS — M79641 Pain in right hand: Secondary | ICD-10-CM | POA: Diagnosis not present

## 2024-01-19 DIAGNOSIS — M79604 Pain in right leg: Secondary | ICD-10-CM | POA: Diagnosis not present

## 2024-02-04 DIAGNOSIS — Z419 Encounter for procedure for purposes other than remedying health state, unspecified: Secondary | ICD-10-CM | POA: Diagnosis not present

## 2024-02-19 DIAGNOSIS — E6609 Other obesity due to excess calories: Secondary | ICD-10-CM | POA: Diagnosis not present

## 2024-02-19 DIAGNOSIS — J4530 Mild persistent asthma, uncomplicated: Secondary | ICD-10-CM | POA: Diagnosis not present

## 2024-02-19 DIAGNOSIS — Z6832 Body mass index (BMI) 32.0-32.9, adult: Secondary | ICD-10-CM | POA: Diagnosis not present

## 2024-03-05 DIAGNOSIS — Z419 Encounter for procedure for purposes other than remedying health state, unspecified: Secondary | ICD-10-CM | POA: Diagnosis not present

## 2024-03-21 DIAGNOSIS — E6609 Other obesity due to excess calories: Secondary | ICD-10-CM | POA: Diagnosis not present

## 2024-03-21 DIAGNOSIS — Z6831 Body mass index (BMI) 31.0-31.9, adult: Secondary | ICD-10-CM | POA: Diagnosis not present

## 2024-04-05 DIAGNOSIS — Z419 Encounter for procedure for purposes other than remedying health state, unspecified: Secondary | ICD-10-CM | POA: Diagnosis not present

## 2024-04-18 DIAGNOSIS — R519 Headache, unspecified: Secondary | ICD-10-CM | POA: Diagnosis not present

## 2024-04-18 DIAGNOSIS — Z5321 Procedure and treatment not carried out due to patient leaving prior to being seen by health care provider: Secondary | ICD-10-CM | POA: Diagnosis not present

## 2024-04-18 DIAGNOSIS — R0789 Other chest pain: Secondary | ICD-10-CM | POA: Diagnosis not present

## 2024-04-19 DIAGNOSIS — R079 Chest pain, unspecified: Secondary | ICD-10-CM | POA: Diagnosis not present

## 2024-04-20 DIAGNOSIS — R519 Headache, unspecified: Secondary | ICD-10-CM | POA: Diagnosis not present

## 2024-04-21 DIAGNOSIS — E6609 Other obesity due to excess calories: Secondary | ICD-10-CM | POA: Diagnosis not present

## 2024-04-21 DIAGNOSIS — Z6832 Body mass index (BMI) 32.0-32.9, adult: Secondary | ICD-10-CM | POA: Diagnosis not present

## 2024-05-06 DIAGNOSIS — Z419 Encounter for procedure for purposes other than remedying health state, unspecified: Secondary | ICD-10-CM | POA: Diagnosis not present

## 2024-05-20 DIAGNOSIS — Z6831 Body mass index (BMI) 31.0-31.9, adult: Secondary | ICD-10-CM | POA: Diagnosis not present

## 2024-05-20 DIAGNOSIS — E6609 Other obesity due to excess calories: Secondary | ICD-10-CM | POA: Diagnosis not present

## 2024-06-20 DIAGNOSIS — Z6832 Body mass index (BMI) 32.0-32.9, adult: Secondary | ICD-10-CM | POA: Diagnosis not present

## 2024-06-20 DIAGNOSIS — E6609 Other obesity due to excess calories: Secondary | ICD-10-CM | POA: Diagnosis not present
